# Patient Record
Sex: Female | Born: 1965
Health system: Southern US, Community
[De-identification: ages and names within clinical notes are randomized; demographics above are authoritative.]

## PROBLEM LIST (undated history)

## (undated) DIAGNOSIS — D649 Anemia, unspecified: Secondary | ICD-10-CM

## (undated) DIAGNOSIS — C801 Malignant (primary) neoplasm, unspecified: Secondary | ICD-10-CM

## (undated) DIAGNOSIS — M1990 Unspecified osteoarthritis, unspecified site: Secondary | ICD-10-CM

## (undated) HISTORY — PX: CHOLECYSTECTOMY: SHX55

## (undated) HISTORY — PX: KNEE SURGERY: SHX244

## (undated) HISTORY — PX: BREAST LUMPECTOMY: SHX2

## (undated) HISTORY — DX: Unspecified osteoarthritis, unspecified site: M19.90

## (undated) HISTORY — DX: Anemia, unspecified: D64.9

## (undated) HISTORY — DX: Malignant (primary) neoplasm, unspecified: C80.1

---

## 2011-05-29 ENCOUNTER — Encounter (HOSPITAL_COMMUNITY): Payer: Self-pay | Admitting: Emergency Medicine

## 2011-05-29 ENCOUNTER — Emergency Department (HOSPITAL_COMMUNITY)
Admission: EM | Admit: 2011-05-29 | Discharge: 2011-05-29 | Disposition: A | Discharge status: Home / Self Care | Payer: BC Managed Care – PPO | Attending: Emergency Medicine | Admitting: Emergency Medicine

## 2011-05-29 DIAGNOSIS — IMO0001 Reserved for inherently not codable concepts without codable children: Secondary | ICD-10-CM

## 2011-05-29 DIAGNOSIS — L500 Allergic urticaria: Secondary | ICD-10-CM | POA: Insufficient documentation

## 2011-05-29 MED ORDER — PREDNISONE 20 MG PO TABS: 60.0000 mg | ORAL_TABLET | Freq: Once | ORAL | Status: AC

## 2011-05-29 MED ORDER — DIPHENHYDRAMINE HCL 25 MG PO CAPS
50.0000 mg | ORAL_CAPSULE | Freq: Once | ORAL | Status: AC
  Administered 2011-05-29: 50 mg via ORAL
  Filled 2011-05-29: qty 2

## 2011-05-29 MED ORDER — FAMOTIDINE 20 MG PO TABS: 20.0000 mg | ORAL_TABLET | Freq: Once | ORAL | Status: DC

## 2011-05-29 MED ORDER — FAMOTIDINE 20 MG PO TABS
20.0000 mg | ORAL_TABLET | Freq: Once | ORAL | Status: AC
  Administered 2011-05-29: 20 mg via ORAL
  Filled 2011-05-29: qty 1

## 2011-05-29 MED ORDER — DIPHENHYDRAMINE HCL 50 MG PO CAPS: 50.0000 mg | ORAL_CAPSULE | Freq: Four times a day (QID) | ORAL | Status: DC | PRN

## 2011-05-29 MED ORDER — PREDNISONE 20 MG PO TABS
60.0000 mg | ORAL_TABLET | Freq: Once | ORAL | Status: AC
  Administered 2011-05-29: 60 mg via ORAL
  Filled 2011-05-29: qty 3

## 2011-05-29 NOTE — Discharge Instructions (Signed)
Reaccin alrgica - Leve a moderada (Allergic Reaction, Mild to Moderate) Las alergias pueden ser ocasionadas por cualquier cosa a la que su organismo sea sensible. Pueden ser alimentos, medicamentos, polen, sustancias qumicas y casi cualquiera de las cosas que lo rodean en su vida diaria que producen alrgenos. Un alrgeno es todo lo que hace que una sustancia produzca alergia. Esto hace que el organismo libere anticuerpos. A travs de una cadena de eventos, estos finalmente hacen que usted libere histamina en el torrente sanguneo. La funcin de la histamina es protegerlo, pero tambin puede causar molestias. Es por eso que en el caso de las alergias se utilizan los antihistamnicos. La herencia es uno de los factores que interviene en las reacciones alrgicas. Esto significa que usted puede sufrir alguna de las alergias que sufrieron sus padres. Las alergias pueden ocurrir a cualquier edad. Usted puede tener cierta idea de lo que ha causado la reaccin. Estamos rodeados por gran cantidad de alrgenos. Puede resultar difcil saber que caus su reaccin. Si este es su primer ataque, puede ser que no le vuelva a ocurrir. Las alergias no pueden curarse pero pueden controlarse con medicamentos. SNTOMAS Una alergia puede causarle alguno o todos los siguientes sntomas.   Hinchazn y prurito en el interior de la boca y alrededor de la misma.   Lagrimeo y picazn en los ojos.   Congestin nasal y goteo nasal.   Estornudos y tos.   Una erupcin roja que produce picazn o urticaria.   Vmitos o diarrea.   Dificultad para respirar.  Las alergias estacionales pueden ocurrir a cualquier edad. Se denominan as porque generalmente se producen durante la misma estacin todos los aos. Puede ser una reaccin al moho, al polen del csped o al polen de los rboles. Otras causas del problema son los alrgenos que contienen los caros del polvo del hogar, el pelaje de las mascotas y las esporas del moho. Estos  son slo algunos de los ms comunes de entre los cientos de alrgenos que nos rodean. Todos estos sntomas aparecen cuando se entra en contacto con el polen y otros alrgenos. Estas alergias estacionales generalmente no ponen en peligro la vida. Generalmente se trata de una incomodidad que puede aliviarse con medicamentos. La fiebre del heno es una combinacin de todos o algunos de los problemas alrgicos enumerados. Simplemente se tratan con medicamentos de venta libre como difenhidramina (Benadryl). Tome los medicamentos segn las indicaciones. Consulte con el profesional que lo asiste o siga las instrucciones de uso para las dosis para nios. TRATAMIENTO Y CUIDADO DOMICILIARIO Si presenta urticaria o una erupcin cutnea:  Tome los medicamentos como se le indic.   Puede utilizar un antihistamnico de venta libre (difenhidramina) para la urticaria y la picazn, segn sea necesario. No conduzca ni beba alcohol hasta que desaparezca el efecto de los medicamentos para tratar la reaccin. Los antihistamnicos causan somnolencia.   Aplquese compresas fras sobre la piel o tome baos de agua fra. Ayuda a calmar la picazn. Evite los baos o las duchas calientes. El calor puede hacer que la urticaria y la picazn empeoren.   Si las alergias persisten y se hacen ms graves, y los medicamentos de venta libre no son efectivos, existen muchos medicamentos nuevos que el mdico le puede prescribir Otros tratamientos como la inmunoterapia o las inyecciones desensibilizantes pueden utilizarse si todos estos fracasan. Haga una consulta de seguimiento con el profesional que lo asiste si los problemas continan.  SOLICITE ANTENCIN MDICA SI:  Las alergias se tornan cada   vez ms graves.   Sospecha que puede sufrir una alergia a algn alimento. Los sntomas generalmente ocurren dentro de los 30 minutos posteriores a haber ingerido el alimento.   Los sntomas persistieron durante 2 das o han empeorado.    Desarrolla nuevos sntomas.   Quiere volver a probar o que su hijo consuma nuevamente un alimento o bebida que usted cree que le causa una reaccin alrgica. Nunca realice una prueba en usted mismo, ni en su nio, si sospecha una alergia, si no se encuentra bajo la observacin de su mdico. Una segunda exposicin a un alrgeno puede poner en peligro su vida.  SOLICITE ATENCIN MDICA DE INMEDIATO SI PRESENTA:  Dificultad para respirar, respiracin ruidosa o siente una sensacin de opresin en el pecho o en la garganta.   Presenta sarpullido, hinchazn o picazn en todo el cuerpo.  Una reaccin grave con cualquiera de los problemas mencionados debe considerarse como peligrosa para la vida. Si presenta dificultad para respirar de manera sbita, comunquese con el servicio de emergencias y solicite asistencia mdica. ESTO ES UNA EMERGENCIA. ASEGRESE QUE:   Comprende estas instrucciones.   Controlar su enfermedad.   Solicitar ayuda inmediatamente si no mejora o si empeora.  Document Released: 04/25/2008 Document Revised: 01/16/2011 ExitCare Patient Information 2012 [employer]. 

## 2011-05-29 NOTE — ED Provider Notes (Signed)
History     CSN: [id number]  Arrival date & time 05/29/11  2159   First MD Initiated Contact with Patient 05/29/11 2227      Chief Complaint  Patient presents with  . Rash  . Allergic Reaction    (Consider location/radiation/quality/duration/timing/severity/associated sxs/prior treatment) Patient is a 46 y.o. female presenting with rash and allergic reaction. The history is provided by the patient and the spouse.  Rash  This is a new problem. The current episode started 1 to 2 hours ago. The problem has been gradually worsening. There has been no fever. Affected Location: Rash is generalized. Associated symptoms include itching. Associated symptoms comments: She ate shrimp 3 hours prior to symptoms. She has known shellfish allergy, however, but did not realize there was shrimp in the food she ate. No SOB, trouble swallowing.. She has tried nothing for the symptoms.  Allergic Reaction The primary symptoms are  rash. The primary symptoms do not include shortness of breath.  The rash is associated with itching.  Significant symptoms also include itching.    History reviewed. No pertinent past medical history.  History reviewed. No pertinent past surgical history.  No family history on file.  History  Substance Use Topics  . Smoking status: Not on file  . Smokeless tobacco: Not on file  . Alcohol Use: Not on file    OB History    Grav Para Term Preterm Abortions TAB SAB Ect Mult Living                  Review of Systems  Constitutional: Negative for fever and chills.  HENT: Negative for facial swelling, trouble swallowing and voice change.   Respiratory: Negative.  Negative for shortness of breath.   Cardiovascular: Negative.   Skin: Positive for itching and rash.    Allergies  Shellfish allergy  Home Medications  No current outpatient prescriptions on file.  BP 113/67  Pulse 72  Temp(Src) 98.5 F (36.9 C) (Oral)  Resp 18  SpO2 98%  Physical Exam    Constitutional: She appears well-developed and well-nourished.  HENT:  Head: Normocephalic.       No swelling of lips or tongue. Oropharynx benign.  Neck: Normal range of motion. Neck supple.  Cardiovascular: Normal rate and regular rhythm.   Pulmonary/Chest: Effort normal and breath sounds normal. No stridor. She has no wheezes. She has no rales.  Abdominal: Soft. Bowel sounds are normal. There is no tenderness. There is no rebound and no guarding.  Musculoskeletal: Normal range of motion. She exhibits no edema.  Neurological: She is alert.  Skin: Skin is warm and dry.       Maculopapular rash that is slightly erythematous in a general distribution. Some welts on upper arms.   Psychiatric: She has a normal mood and affect.    ED Course  Procedures (including critical care time)  Labs Reviewed - No data to display No results found.   No diagnosis found. 1. Acute allergic reaction   MDM  She is complaining of itching rash only, without airway involvment. Medications (Benadryl, Pepcid, Prednisone) given. Will observe for improvement.   23:45:  Feeling better, rash is still present but improved. Denied any onset of SOB or other symptoms.       Rodena Medin, PA-C 05/29/11 2346

## 2011-05-30 NOTE — ED Provider Notes (Signed)
Medical screening examination/treatment/procedure(s) were performed by non-physician practitioner and as supervising physician I was immediately available for consultation/collaboration.  Bobbe Quilter, MD 05/30/11 2305 

## 2011-11-13 ENCOUNTER — Emergency Department (HOSPITAL_COMMUNITY): Payer: Self-pay

## 2011-11-13 ENCOUNTER — Emergency Department (HOSPITAL_COMMUNITY)
Admission: EM | Admit: 2011-11-13 | Discharge: 2011-11-13 | Disposition: A | Discharge status: Home / Self Care | Payer: Self-pay | Attending: Emergency Medicine | Admitting: Emergency Medicine

## 2011-11-13 ENCOUNTER — Other Ambulatory Visit: Payer: Self-pay

## 2011-11-13 ENCOUNTER — Encounter (HOSPITAL_COMMUNITY): Payer: Self-pay | Admitting: Emergency Medicine

## 2011-11-13 DIAGNOSIS — R1031 Right lower quadrant pain: Secondary | ICD-10-CM | POA: Insufficient documentation

## 2011-11-13 DIAGNOSIS — R109 Unspecified abdominal pain: Secondary | ICD-10-CM

## 2011-11-13 DIAGNOSIS — R197 Diarrhea, unspecified: Secondary | ICD-10-CM | POA: Insufficient documentation

## 2011-11-13 DIAGNOSIS — R112 Nausea with vomiting, unspecified: Secondary | ICD-10-CM | POA: Insufficient documentation

## 2011-11-13 LAB — COMPREHENSIVE METABOLIC PANEL
ALT: 37 U/L — ABNORMAL HIGH (ref 0–35)
AST: 18 U/L (ref 0–37)
Calcium: 9.1 mg/dL (ref 8.4–10.5)
Creatinine, Ser: 0.63 mg/dL (ref 0.50–1.10)
GFR calc Af Amer: >90 mL/min (ref >90)
GFR calc non Af Amer: >90 mL/min (ref >90)
Sodium: 136 mEq/L (ref 135–145)
Total Protein: 7.2 g/dL (ref 6.0–8.3)

## 2011-11-13 LAB — CBC WITH DIFFERENTIAL/PLATELET
Basophils Absolute: 0 10*3/uL (ref 0.0–0.1)
Eosinophils Absolute: 0.1 10*3/uL (ref 0.0–0.7)
Eosinophils Relative: 1 % (ref 0–5)
HCT: 35.4 % — ABNORMAL LOW (ref 36.0–46.0)
MCH: 26.5 pg (ref 26.0–34.0)
MCHC: 31.9 g/dL (ref 30.0–36.0)
MCV: 82.9 fL (ref 78.0–100.0)
Monocytes Absolute: 0.6 10*3/uL (ref 0.1–1.0)
Platelets: 277 10*3/uL (ref 150–400)
RDW: 15.3 % (ref 11.5–15.5)
WBC: 10.5 10*3/uL (ref 4.0–10.5)

## 2011-11-13 LAB — URINALYSIS, ROUTINE W REFLEX MICROSCOPIC
Bilirubin Urine: NEGATIVE
Glucose, UA: NEGATIVE mg/dL
Hgb urine dipstick: NEGATIVE
Ketones, ur: NEGATIVE mg/dL
Protein, ur: NEGATIVE mg/dL
Urobilinogen, UA: 0.2 mg/dL (ref 0.0–1.0)

## 2011-11-13 LAB — PREGNANCY, URINE: Preg Test, Ur: NEGATIVE

## 2011-11-13 MED ORDER — LORAZEPAM 2 MG/ML IJ SOLN
0.5000 mg | Freq: Once | INTRAMUSCULAR | Status: AC
  Administered 2011-11-13: 0.5 mg via INTRAVENOUS
  Filled 2011-11-13: qty 1

## 2011-11-13 MED ORDER — SUCRALFATE 1 GM/10ML PO SUSP: 1.0000 g | Freq: Four times a day (QID) | ORAL | Status: DC

## 2011-11-13 MED ORDER — HYDROMORPHONE HCL PF 1 MG/ML IJ SOLN
1.0000 mg | Freq: Once | INTRAMUSCULAR | Status: AC
  Administered 2011-11-13: 1 mg via INTRAVENOUS
  Filled 2011-11-13: qty 1

## 2011-11-13 MED ORDER — ONDANSETRON HCL 4 MG/2ML IJ SOLN
4.0000 mg | Freq: Once | INTRAMUSCULAR | Status: AC
  Administered 2011-11-13: 4 mg via INTRAVENOUS
  Filled 2011-11-13: qty 2

## 2011-11-13 MED ORDER — ONDANSETRON HCL 4 MG PO TABS: 4.0000 mg | ORAL_TABLET | Freq: Four times a day (QID) | ORAL | Status: DC

## 2011-11-13 MED ORDER — IOHEXOL 300 MG/ML  SOLN
100.0000 mL | Freq: Once | INTRAMUSCULAR | Status: AC | PRN
Start: 2011-11-13 — End: 2011-11-13
  Administered 2011-11-13: 100 mL via INTRAVENOUS

## 2011-11-13 MED ORDER — GI COCKTAIL ~~LOC~~
30.0000 mL | Freq: Once | ORAL | Status: AC
  Administered 2011-11-13: 30 mL via ORAL
  Filled 2011-11-13: qty 30

## 2011-11-13 MED ORDER — SODIUM CHLORIDE 0.9 % IV BOLUS (SEPSIS)
1000.0000 mL | Freq: Once | INTRAVENOUS | Status: AC
  Administered 2011-11-13: 1000 mL via INTRAVENOUS

## 2011-11-13 NOTE — ED Notes (Signed)
CT called and made aware that pt finished drinking oral contrast at 12:30.

## 2011-11-13 NOTE — ED Notes (Signed)
Oral contrast given to pt by radiology staff, pt instructed to begin drinking at least 20 minutes after GI cocktail given with understanding verbalized.

## 2011-11-13 NOTE — ED Notes (Signed)
MD at bedside. 

## 2011-11-13 NOTE — ED Notes (Signed)
Sent here from Dr. Alric Ran office to be evaluated for GI bleed/appendicitis. Woke up at 4am, abd pain, with vomiting and diarrhea black tarry looking stool, vomited greenish bile looking emesis.

## 2011-11-13 NOTE — ED Notes (Signed)
Patient transported to CT 

## 2011-11-13 NOTE — ED Provider Notes (Addendum)
History     CSN: [id number]  Arrival date & time 11/13/11  1034   First MD Initiated Contact with Patient 11/13/11 1105      Chief Complaint  Patient presents with  . GI Bleeding  . RLQ pain     (Consider location/radiation/quality/duration/timing/severity/associated sxs/prior treatment) HPI The patient presents with concerns of abdominal pain, vomiting, diarrhea.  She notes that symptoms began yesterday, though became pronounced overnight, within the past 8 hours.  Since onset she has had crampy abdominal pain, nausea.  She had multiple episodes of emesis, green/yellow, and dark diarrhea.  No bowel movements recently.  She notes that the pain was initially sharp, crampy, periumbilical, but is now in her right lower quadrant.  There is some radiation superiorly.   She denies fever, confusion, disorientation, dyspnea. She states that she has been in her usual state of health. She has a history of cholecystectomy in the distant past.   History reviewed. No pertinent past medical history.  Past Surgical History  Procedure Date  . Cholecystectomy     No family history on file.  History  Substance Use Topics  . Smoking status: Former Games developer  . Smokeless tobacco: Not on file  . Alcohol Use: No     for greater than 7 years    OB History    Grav Para Term Preterm Abortions TAB SAB Ect Mult Living                  Review of Systems  Constitutional:       HPI  HENT:       HPI otherwise negative  Eyes: Negative.   Respiratory:       HPI, otherwise negative  Cardiovascular:       HPI, otherwise nmegative  Gastrointestinal: Positive for nausea, vomiting, abdominal pain and diarrhea.  Genitourinary:       HPI, otherwise negative  Musculoskeletal:       HPI, otherwise negative  Skin: Negative.   Neurological: Negative for syncope.    Allergies  Shellfish allergy  Home Medications   Current Outpatient Rx  Name Route Sig Dispense Refill  . DIPHENHYDRAMINE HCL 50  MG PO CAPS Oral Take 1 capsule (50 mg total) by mouth every 6 (six) hours as needed for itching. 12 capsule 0  . FAMOTIDINE 20 MG PO TABS Oral Take 1 tablet (20 mg total) by mouth once. 8 tablet 0    BP 127/83  Pulse 94  Temp 98.1 F (36.7 C)  Resp 20  SpO2 100%  LMP 11/05/2011  Physical Exam  Nursing note and vitals reviewed. Constitutional: She is oriented to person, place, and time. She appears well-developed and well-nourished. No distress.  HENT:  Head: Normocephalic and atraumatic.  Eyes: Conjunctivae normal and EOM are normal.  Cardiovascular: Normal rate and regular rhythm.   Pulmonary/Chest: Effort normal and breath sounds normal. No stridor. No respiratory distress.  Abdominal: She exhibits no distension. There is no hepatosplenomegaly. There is tenderness in the right lower quadrant, periumbilical area, suprapubic area and left lower quadrant. There is guarding. There is no rigidity, no rebound and no CVA tenderness.  Musculoskeletal: She exhibits no edema.  Neurological: She is alert and oriented to person, place, and time. No cranial nerve deficit.  Skin: Skin is warm and dry.  Psychiatric: She has a normal mood and affect.    ED Course  Procedures (including critical care time)  Labs Reviewed  CBC WITH DIFFERENTIAL - Abnormal; Notable for the  following:    Hemoglobin 11.3 (*)     HCT 35.4 (*)     Neutro Abs 7.8 (*)     All other components within normal limits  URINALYSIS, ROUTINE W REFLEX MICROSCOPIC  COMPREHENSIVE METABOLIC PANEL  LIPASE, BLOOD  PREGNANCY, URINE   No results found.   No diagnosis found.   Pulse ox 99% room air normal   Date: 11/13/2011  Rate: 89  Rhythm: normal sinus rhythm  QRS Axis: normal  Intervals: normal  ST/T Wave abnormalities: normal  Conduction Disutrbances: none  Narrative Interpretation: unremarkable      12:09 PM   Following provision of dilaudid the patient noted an increase in her nausea / cp / and on my  re-eval she had chattering teeth, VSS  On repeat evaluation after the CAT scan the patient is resting comfortably, notes her symptoms are significantly better.   MDM  This previously well female presents with one day of abdominal pain nausea, vomiting, diarrhea.  On exam she is uncomfortable with palpation, but otherwise in no distress.  The patient's tenderness is most prominent in her right lower quadrant, raising suspicion for appendicitis, though her description of symptoms is equally consistent with gastroenteritis.  The patient's labs, radiographic studies were all reassuring, and following ED interventions the patient was substantially more comfortable.  Given his absence of distress, unremarkable vital signs, the patient was discharged in stable condition with PMD/GI followup.      Gerhard Munch, MD 11/13/11 1603  Gerhard Munch, MD 11/13/11 [phone]

## 2013-10-13 ENCOUNTER — Ambulatory Visit: Payer: BC Managed Care – PPO | Admitting: Cardiology

## 2013-10-13 ENCOUNTER — Encounter: Payer: Self-pay | Admitting: *Deleted

## 2013-11-17 ENCOUNTER — Ambulatory Visit: Payer: BC Managed Care – PPO | Admitting: Cardiology

## 2013-11-28 ENCOUNTER — Encounter: Payer: Self-pay | Admitting: Cardiology

## 2015-04-07 ENCOUNTER — Other Ambulatory Visit (HOSPITAL_COMMUNITY)
Admission: EM | Admit: 2015-04-07 | Discharge: 2015-04-07 | Disposition: A | Discharge status: Home / Self Care | Payer: BLUE CROSS/BLUE SHIELD

## 2015-04-07 ENCOUNTER — Emergency Department (HOSPITAL_COMMUNITY): Payer: BLUE CROSS/BLUE SHIELD

## 2015-04-07 ENCOUNTER — Encounter (HOSPITAL_COMMUNITY): Payer: Self-pay | Admitting: Emergency Medicine

## 2015-04-07 ENCOUNTER — Emergency Department (HOSPITAL_COMMUNITY)
Admission: EM | Admit: 2015-04-07 | Discharge: 2015-04-07 | Disposition: A | Discharge status: Home / Self Care | Payer: BLUE CROSS/BLUE SHIELD | Attending: Emergency Medicine | Admitting: Emergency Medicine

## 2015-04-07 DIAGNOSIS — N939 Abnormal uterine and vaginal bleeding, unspecified: Secondary | ICD-10-CM

## 2015-04-07 DIAGNOSIS — Y9389 Activity, other specified: Secondary | ICD-10-CM | POA: Insufficient documentation

## 2015-04-07 DIAGNOSIS — Z87891 Personal history of nicotine dependence: Secondary | ICD-10-CM | POA: Diagnosis not present

## 2015-04-07 DIAGNOSIS — S3991XA Unspecified injury of abdomen, initial encounter: Secondary | ICD-10-CM | POA: Diagnosis not present

## 2015-04-07 DIAGNOSIS — W19XXXA Unspecified fall, initial encounter: Secondary | ICD-10-CM

## 2015-04-07 DIAGNOSIS — W108XXA Fall (on) (from) other stairs and steps, initial encounter: Secondary | ICD-10-CM | POA: Diagnosis not present

## 2015-04-07 DIAGNOSIS — D251 Intramural leiomyoma of uterus: Secondary | ICD-10-CM | POA: Diagnosis not present

## 2015-04-07 DIAGNOSIS — Y9289 Other specified places as the place of occurrence of the external cause: Secondary | ICD-10-CM | POA: Diagnosis not present

## 2015-04-07 DIAGNOSIS — S3992XA Unspecified injury of lower back, initial encounter: Secondary | ICD-10-CM | POA: Diagnosis not present

## 2015-04-07 DIAGNOSIS — S3993XA Unspecified injury of pelvis, initial encounter: Secondary | ICD-10-CM | POA: Diagnosis not present

## 2015-04-07 DIAGNOSIS — Y998 Other external cause status: Secondary | ICD-10-CM | POA: Diagnosis not present

## 2015-04-07 DIAGNOSIS — Z3202 Encounter for pregnancy test, result negative: Secondary | ICD-10-CM | POA: Diagnosis not present

## 2015-04-07 DIAGNOSIS — Z87828 Personal history of other (healed) physical injury and trauma: Secondary | ICD-10-CM

## 2015-04-07 MED ORDER — MORPHINE SULFATE (PF) 4 MG/ML IV SOLN
4.0000 mg | Freq: Once | INTRAVENOUS | Status: AC
  Administered 2015-04-07: 4 mg via INTRAVENOUS
  Filled 2015-04-07: qty 1

## 2015-04-07 MED ORDER — IOHEXOL 350 MG/ML SOLN
100.0000 mL | Freq: Once | INTRAVENOUS | Status: AC | PRN
  Administered 2015-04-07: 100 mL via INTRAVENOUS

## 2015-04-07 MED ORDER — IOHEXOL 300 MG/ML  SOLN: 25.0000 mL | Freq: Once | INTRAMUSCULAR | Status: DC | PRN

## 2015-04-07 MED ORDER — HYDROCODONE-ACETAMINOPHEN 5-325 MG PO TABS: 1.0000 | ORAL_TABLET | Freq: Four times a day (QID) | ORAL | Status: AC | PRN

## 2015-04-07 MED ORDER — ONDANSETRON HCL 4 MG/2ML IJ SOLN
4.0000 mg | Freq: Once | INTRAMUSCULAR | Status: AC
  Administered 2015-04-07: 4 mg via INTRAVENOUS
  Filled 2015-04-07: qty 2

## 2015-04-07 MED ORDER — NAPROXEN 500 MG PO TABS: 500.0000 mg | ORAL_TABLET | Freq: Two times a day (BID) | ORAL | Status: DC

## 2015-04-07 NOTE — ED Notes (Signed)
Patient ambulated to restroom with stand by nursing assist. Steady on feet at this time. Patient given pad and mesh underwear.

## 2015-04-07 NOTE — ED Notes (Signed)
MD and PA at bedside to complete bedside US on patient of abdomen. No internal bleeding noted at this time.

## 2015-04-07 NOTE — Discharge Instructions (Signed)
Taking Naprosyn for pain and inflammation as prescribed. Norco for severe pain only. Pelvic rest. If bleeding is worsening or develop any worsening pain or symptoms please return or follow-up with OB/GYN.   Sangrado uterino anormal (Abnormal Uterine Bleeding) El sangrado uterino anormal puede afectar a las mujeres que estn en diversas etapas de la vida, desde adolescentes, mujeres frtiles y Games developer, hasta mujeres que han llegado a la menopausia. Hay diversas clases de sangrado uterino que se consideran anormales, entre ellas:  Prdidas de sangre o [employer] perodos.  Hemorragias luego de Retail banker.  Sangrado abundante o ms que lo habitual.  Perodos que duran ms que lo normal.  Sangrado luego de la menopausia. Muchos casos de sangrado uterino anormal son leves y simples de tratar, mientras que otros son ms graves. El mdico debe evaluar cualquier clase de sangrado anormal. El tratamiento depender de la causa del sangrado. INSTRUCCIONES PARA EL CUIDADO EN EL HOGAR Controle su afeccin para ver si hay cambios. Las siguientes indicaciones ayudarn a Chief Strategy Officer que pueda sentir:  Evite las duchas vaginales y el uso de tampones segn las indicaciones del mdico.  [place] compresas con frecuencia. Deber hacerse exmenes plvicos regulares y pruebas de [place]. Cumpla con todas las visitas de control y [employer] diagnsticos, segn le indique su mdico.  SOLICITE ATENCIN MDICA SI:   El sangrado dura ms de 1 semana.  Se siente mareada por momentos. SOLICITE ATENCIN MDICA DE INMEDIATO SI:   Se desmaya.  Debe cambiarse la compresa cada 15 a 30 minutos.  Siente dolor abdominal.  [place].  Se siente dbil o presenta sudoracin.  Elimina cogulos grandes por la vagina.  Comienza a sentir nuseas y [place]. ASEGRESE DE QUE:   Comprende estas instrucciones.  Controlar su afeccin.  Recibir ayuda  de inmediato si no mejora o si empeora.   Esta informacin no tiene Marine scientist el consejo del mdico. Asegrese de hacerle al mdico cualquier pregunta que tenga.   Document Released: 01/27/2005 Document Revised: 02/01/2013 Elsevier Interactive Patient Education 2016 [place] uterinos (Uterine Fibroids) Los fibromas uterinos son masas (tumores) de tejido que pueden desarrollarse en el vientre (tero). Tambin se los [employer]. Este tipo de tumor no es Radio broadcast assistant (benigno) y no se disemina a Airline pilot del cuerpo fuera de la zona plvica, la cual se encuentra entre los huesos de la cadera. En ocasiones, los fibromas pueden crecer en las trompas de [place], en el cuello del tero o en las estructuras de soporte (ligamentos) que rodean el tero. Una mujer puede tener uno o ms fibromas. Los fibromas pueden tener diferente tamao y [place], y crecer en distintas partes del tero. Algunos pueden crecer hasta volverse bastante grandes. La mayora no requiere tratamiento mdico. CAUSAS Un fibroma puede desarrollarse cuando una nica clula uterina contina creciendo (se multiplica). La mayora de las clulas del cuerpo humano tienen un mecanismo de control que impide que se multipliquen sin control. [place] los sntomas se pueden incluir los siguientes:   Hemorragias intensas durante la menstruacin.  Prdidas de sangre o [employer].  Dolor y opresin en la pelvis.  Problemas de la vejiga, como necesidad de Garment/textile technologist con ms frecuencia (polaquiuria) o necesidad imperiosa de Garment/textile technologist.  Incapacidad para reproducir (infertilidad).  Abortos espontneos. DIAGNSTICO Los fibromas uterinos se diagnostican con un examen fsico. El mdico puede palpar los tumores grumosos durante un examen plvico. Pueden realizarse ecografas y Janice Norrie magntica para  determinar el tamao y la ubicacin de los fibromas, as como la  cantidad. TRATAMIENTO El tratamiento puede incluir lo siguiente:  Observacin cautelosa. Esto requiere que el mdico controle el fibroma para saber si crece o se achica. Siga las recomendaciones del mdico respecto de la frecuencia con la que debe realizarse los controles.  Medicamentos hormonales. Pueden tomarse por va oral o administrarse a travs de un dispositivo intrauterino (DIU).  Ciruga.  Extirpacin de los fibromas (miomectoma) o del tero (histerectoma).  Suprimir la irrigacin sangunea a los fibromas (embolizacin de la arteria uterina). Si los fibromas le traen problemas de fertilidad y tiene deseos de quedar [place], el mdico puede recomendar su extirpacin.  INSTRUCCIONES PARA EL CUIDADO EN EL HOGAR  Concurra a todas las visitas de control como se lo haya indicado el mdico. Esto es importante.  Tome los medicamentos solamente como se lo haya indicado el mdico.  Si le recetaron un tratamiento hormonal, tome los medicamentos hormonales exactamente como se lo indicaron.  No tome aspirina, ya que puede causar hemorragias.  Consulte al [employer] sobre tomar comprimidos de hierro y Garment/textile technologist la cantidad de verduras de hoja color verde oscuro en la dieta. Estas medidas pueden ayudar a Transport planner de hierro en la [place], que pueden verse afectados por las hemorragias menstruales intensas.  Preste mucha atencin a Hydrographic surveyor e informe al mdico si hay algn cambio, por ejemplo:  Aumento del flujo de sangre que le exige el uso de ms compresas o tampones que los que utiliza normalmente cada mes.  Un cambio en la cantidad de [employer] dura la menstruacin cada mes.  Un cambio en los sntomas asociados con la [place], como clicos abdominales o dolor de espalda. SOLICITE ATENCIN MDICA SI:  Tiene dolor plvico, dolor de espalda o clicos abdominales que los medicamentos no Engineer, petroleum.  Observa un aumento del sangrado entre y [employer].  Empapa los tampones o las compresas en el trmino de media hora o [place].  Se siente mareada, muy cansada o dbil. SOLICITE ATENCIN MDICA DE INMEDIATO SI:  Se desmaya.  El dolor plvico aumenta repentinamente.   Esta informacin no tiene Marine scientist el consejo del mdico. Asegrese de hacerle al mdico cualquier pregunta que tenga.   Document Released: 01/27/2005 Document Revised: 02/17/2014 Elsevier Interactive Patient Education [employer].

## 2015-04-07 NOTE — ED Provider Notes (Signed)
CSN: [id number]     Arrival date & time 04/07/15  1554 History   First MD Initiated Contact with Patient 04/07/15 1618     Chief Complaint  Patient presents with  . Fall  . Vaginal Bleeding     (Consider location/radiation/quality/duration/timing/severity/associated sxs/prior Treatment) HPI Tara Ray is a 50 y.o. female who presents to emergency department complaining of fall and lower abdominal and back pain. Patient states that she was walking down the stairs, states she missed a step and fell down onto her bottom and slid down approximately 3 steps. She states that she immediately developed pain in her lower back and lower abdomen. A few minutes later she noticed that she was hemorrhaging from her private area. Patient states she is unsure exactly where the bleeding is coming from. She describes bleeding as severe, states "my underwear MI close was covered in blood." She states that she went home and changed her clothes and then came to the emergency department. She reports the bleeding continues. She reports severe pain over the pelvic area. She denies hitting her head or losing consciousness. She denies pain to her extremities. She is able to ambulate but very painful.   History reviewed. No pertinent past medical history. Past Surgical History  Procedure Laterality Date  . Cholecystectomy     Family History  Problem Relation Age of Onset  . Hypertension Mother    Social History  Substance Use Topics  . Smoking status: Former Research scientist (life sciences)  . Smokeless tobacco: None  . Alcohol Use: No     Comment: for greater than 7 years   OB History    No data available     Review of Systems  Constitutional: Negative for fever and chills.  Respiratory: Negative for cough, chest tightness and shortness of breath.   Cardiovascular: Negative for chest pain, palpitations and leg swelling.  Gastrointestinal: Positive for abdominal pain. Negative for nausea, vomiting and diarrhea.   Genitourinary: Positive for pelvic pain. Negative for dysuria, flank pain, vaginal bleeding, vaginal discharge and vaginal pain.  Musculoskeletal: Positive for myalgias, back pain, arthralgias and gait problem. Negative for neck pain and neck stiffness.  Skin: Negative for rash.  Neurological: Negative for dizziness, weakness, numbness and headaches.  All other systems reviewed and are negative.     Allergies  Shellfish allergy  Home Medications   Prior to Admission medications   Medication Sig Start Date End Date Taking? Authorizing Provider  ibuprofen (ADVIL,MOTRIN) 200 MG tablet Take 400 mg by mouth every 6 (six) hours as needed for headache, mild pain or moderate pain.   Yes Historical Provider, MD   BP 132/74 mmHg  Pulse 84  Temp(Src) 97.8 F (36.6 C) (Oral)  Resp 18  SpO2 100% Physical Exam  Constitutional: She appears well-developed and well-nourished. No distress.  HENT:  Head: Normocephalic.  Eyes: Conjunctivae are normal.  Neck: Neck supple.  Cardiovascular: Normal rate, regular rhythm and normal heart sounds.   Pulmonary/Chest: Effort normal and breath sounds normal. No respiratory distress. She has no wheezes. She has no rales.  Abdominal: Soft. Bowel sounds are normal. She exhibits no distension. There is tenderness. There is no rebound.  Lower abdominal tenderness with guarding  Genitourinary:  Normal external genitalia with no tears noted. Normal vaginal canal except for pulling of the blood. Cervix is normal. No lesions visualized.  Musculoskeletal: She exhibits no edema.  Midline lumbar spine tenderness, bilateral pelvic tenderness. Pain with range of motion of bilateral hips. DP pulses intact.  Neurological: She is alert.  Skin: Skin is warm and dry.  Psychiatric: She has a normal mood and affect. Her behavior is normal.  Nursing note and vitals reviewed.   ED Course  Procedures (including critical care time) Labs Review Labs Reviewed  I-STAT CHEM  8, ED  I-STAT BETA HCG BLOOD, ED ([facility], [facility], [facility] ONLY)    Imaging Review Ct Abdomen Pelvis W Contrast  04/07/2015  CLINICAL DATA:  Fall this morning. Vaginal bleeding since that time. EXAM: CT ABDOMEN AND PELVIS WITH CONTRAST TECHNIQUE: Multidetector CT imaging of the abdomen and pelvis was performed using the standard protocol following bolus administration of intravenous contrast. CONTRAST:  16mL OMNIPAQUE IOHEXOL 350 MG/ML SOLN COMPARISON:  None. FINDINGS: The lung bases are normal. No free air or fluid. The liver and portal vein are normal. The patient is status postcholecystectomy. Prominence of the bile ducts is likely due to previous cholecystectomy. The spleen and right adrenal gland are normal. There is a small nodule associated with the left adrenal gland measuring 12 mm. This nodule does appear to enhance. The left kidney is normal in appearance. The right kidney demonstrates multiple areas of cortical thinning consistent with previous infarcts or infections. No acute abnormality in the right kidney. The pancreas is normal in appearance. The abdominal aorta is non aneurysmal with no dissection. No adenopathy identified within the abdomen. There is a fat containing umbilical hernia. The ventral wall is otherwise intact. The stomach and small bowel are within normal limits. The colon and appendix are normal. There is a fibroid near the uterine fundus measuring up to 2.7 cm. Another probable fibroid is seen posteriorly in the body of the uterus on image 128 measuring 1 cm. No other uterine masses are identified. The uterus is otherwise within normal limits. The right ovary is normal in appearance. There appear to be 2 small cysts in the left ovary seen on image 115, both measuring approximately 1 cm. The bladder is within normal limits. Pelvic loops of bowel are normal. No adenopathy seen in the pelvis. Sclerotic foci in the pelvis are nonspecific but may be bone islands. No other bony abnormalities are  identified. IMPRESSION: 1. There are 2 fibroids seen in the uterus. Uterus is otherwise normal in appearance. 2. 2 1 cm cysts in the left ovary are of doubtful significance. 3. Cortical thinning throughout the right kidney consistent with previous infarcts or infection. 4. 12 mm left adrenal nodule. This nodule does appear to enhance and is nonspecific. MRI could further characterize. Of note, arterial phase imaging was attempted but failed due to technical difficulties. Electronically Signed   By: Dorise Bullion III M.D   On: 04/07/2015 20:20   Dg Pelvis Portable  04/07/2015  CLINICAL DATA:  Fall today at work.  Pain EXAM: PORTABLE PELVIS 1-2 VIEWS COMPARISON:  None. FINDINGS: SI joints and hip joints are symmetric and unremarkable. No acute bony abnormality. Specifically, no fracture, subluxation, or dislocation. Soft tissues are intact. IMPRESSION: Negative. Electronically Signed   By: Rolm Baptise M.D.   On: 04/07/2015 17:02   I have personally reviewed and evaluated these images and lab results as part of my medical decision-making.   EKG Interpretation None      MDM   Final diagnoses:  History of pelvic trauma  Vaginal bleeding  Fall, initial encounter  Intramural leiomyoma of uterus      Patient with heavy vaginal bleeding onset after a fall in her bottom. Patient is complaining of abdominal pain and  lower back pain. On exam patient with pooling blood in her vaginal canal. No obvious pain and vaginal area no obvious tears. Bedside FAST exam by Dr. Dolly Rias with no definite free fluid. Bedside pelvic x-ray negative. I discussed with radiologist the study choice, will order CT angiogram of abdomen and pelvis for further evaluation. Hemodynamically stable at this time. With normal vital signs.  5:35 PM  computer glitch with patient's lab orders. now 3 times we ordered blood work which continues to be canceled by "lab." I discussed with lab tech, they will work on that issue.    Patient's lab work did not crossover. Normal PT and PTT at PT-14, PTT- 27.7. White blood cell count is 7. Hemoglobin 10.6, hematocrit 34. Platelets 257. Chemistry panel showed normal electrolytes and creatinine of 0.7. Will get CT abdomen and pelvis.   8:59 PM Another problem with CT due to patient's registration. CT finally resulted with no acute injuries. CT does show 2 fibroids in the uterus. Also showing some thinning of the right kidney consistent with prior infarct or infection. 1 cm cyst noted in the left ovary. 12 mm left adrenal nodule that is nonspecific, will follow palpation. I discussed patient's presentation, symptoms and CT finding with Dr. Roselie Awkward with OB/GYN. Patient also now states that she did start her period 2 days ago but states it stopped yesterday. She states her periods normally last 4 days. Dr. Roselie Awkward believes this could be just in the irregular menstrual bleeding. At this time patient has normal vital signs. She has been in emergency department for 5 hours now. She continues to be hemodynamically stable. We'll discharge home, given strict precautions to return if worsening bleeding. Pt voiced udnerstanding.   Filed Vitals:   04/07/15 1602 04/07/15 1900 04/07/15 2042 04/07/15 2125  BP: 132/74 124/72 117/68 135/70  Pulse: 84 68 67 63  Temp: 97.8 F (36.6 C)  98.1 F (36.7 C) 98.4 F (36.9 C)  TempSrc: Oral  Oral Oral  Resp: 18 16 17 13   SpO2: 100% 100% 98% 97%     Jeannett Senior, PA-C 04/08/15 0044  Merrily Pew, MD 04/08/15 1156

## 2015-04-07 NOTE — ED Notes (Signed)
Pt reports she fell at 1230 this am. No LOC. When she stood up she began to have vaginal bleeding. Pt reports continuing vaginal bleeding and bilateral lower abd pain.

## 2015-04-07 NOTE — ED Notes (Signed)
Patient transported to CT 

## 2015-04-07 NOTE — ED Provider Notes (Signed)
Medical screening examination/treatment/procedure(s) were conducted as a shared visit with non-physician practitioner(s) and myself.  I personally evaluated the patient during the encounter.  Fall with subsequent vaginal bleeding. Had started period a couple days ago but then stopped. Exam by PA showed pooling blood in the vaginal vault but not severe hemmorhaging. On my exam, not tachycardic or pale or hypotensive. Abdomen benign. FAST exam negative for free fluid. Concern for possible uterine rupture, fibroid rupture, endometriosis v normal menstrual cycle. Plan for ct scan to rule out uterine rupture and dispo accordingly.   FAST BEDSIDE US Indication: abdominal pain with vaginal bleeding after fall  4 Views obtained: Splenorenal, Morrison's Pouch, Retrovesical No free fluid in abdomen No difficulty obtaining views. Archived electronically I personally performed and interrepreted the images   Merrily Pew, MD 04/08/15 1151

## 2015-04-09 LAB — TYPE AND SCREEN
ABO/RH(D): O POS
ANTIBODY SCREEN: NEGATIVE

## 2015-04-09 LAB — COMPREHENSIVE METABOLIC PANEL
ALBUMIN: 3.5 g/dL (ref 3.5–5.0)
ALT: 24 U/L (ref 14–54)
AST: 17 U/L (ref 15–41)
Alkaline Phosphatase: 78 U/L (ref 38–126)
Anion gap: 7 (ref 5–15)
BILIRUBIN TOTAL: 0.2 mg/dL — AB (ref 0.3–1.2)
BUN: 13 mg/dL (ref 6–20)
CO2: 22 mmol/L (ref 22–32)
CREATININE: 0.74 mg/dL (ref 0.44–1.00)
Calcium: 8.9 mg/dL (ref 8.9–10.3)
Chloride: 111 mmol/L (ref 101–111)
GFR calc Af Amer: >60 mL/min (ref >60)
GLUCOSE: 92 mg/dL (ref 65–99)
POTASSIUM: 3.7 mmol/L (ref 3.5–5.1)
Sodium: 140 mmol/L (ref 135–145)
TOTAL PROTEIN: 6.5 g/dL (ref 6.5–8.1)

## 2015-04-09 LAB — CBC WITH DIFFERENTIAL/PLATELET
BAND NEUTROPHILS: 0 %
BASOS ABS: 0 10*3/uL (ref 0.0–0.1)
BASOS PCT: 0 %
Eosinophils Absolute: 0.2 10*3/uL (ref 0.0–0.7)
Eosinophils Relative: 3 %
HEMATOCRIT: 34 % — AB (ref 36.0–46.0)
HEMOGLOBIN: 10.6 g/dL — AB (ref 12.0–15.0)
LYMPHS PCT: 29 %
Lymphs Abs: 2 10*3/uL (ref 0.7–4.0)
MCH: 25.1 pg — ABNORMAL LOW (ref 26.0–34.0)
MCHC: 31.2 g/dL (ref 30.0–36.0)
MCV: 80.4 fL (ref 78.0–100.0)
MONO ABS: 0.4 10*3/uL (ref 0.1–1.0)
Monocytes Relative: 6 %
NEUTROS ABS: 4.3 10*3/uL (ref 1.7–7.7)
NEUTROS PCT: 62 %
Platelets: 257 10*3/uL (ref 150–400)
RBC: 4.23 MIL/uL (ref 3.87–5.11)
RDW: 19.4 % — AB (ref 11.5–15.5)
WBC: 7 10*3/uL (ref 4.0–10.5)

## 2015-04-09 LAB — PROTIME-INR
INR: 1.1 (ref 0.00–1.49)
Prothrombin Time: 14 seconds (ref 11.6–15.2)

## 2015-04-09 LAB — I-STAT CHEM 8, ED
BUN: 11 mg/dL (ref 6–20)
CALCIUM ION: 1.22 mmol/L (ref 1.12–1.23)
Chloride: 108 mmol/L (ref 101–111)
Creatinine, Ser: 0.7 mg/dL (ref 0.44–1.00)
Glucose, Bld: 97 mg/dL (ref 65–99)
HCT: 33 % — ABNORMAL LOW (ref 36.0–46.0)
Hemoglobin: 11.2 g/dL — ABNORMAL LOW (ref 12.0–15.0)
Potassium: 3.6 mmol/L (ref 3.5–5.1)
SODIUM: 142 mmol/L (ref 135–145)
TCO2: 23 mmol/L (ref 0–100)

## 2015-04-09 LAB — APTT: aPTT: 28 seconds (ref 24–37)

## 2015-04-09 LAB — I-STAT BETA HCG BLOOD, ED (MC, WL, AP ONLY): I-stat hCG, quantitative: <5 m[IU]/mL (ref <5)

## 2016-01-01 ENCOUNTER — Encounter (HOSPITAL_COMMUNITY): Payer: Self-pay | Admitting: *Deleted

## 2016-01-01 ENCOUNTER — Emergency Department (HOSPITAL_COMMUNITY): Payer: BLUE CROSS/BLUE SHIELD

## 2016-01-01 ENCOUNTER — Emergency Department (HOSPITAL_COMMUNITY)
Admission: EM | Admit: 2016-01-01 | Discharge: 2016-01-02 | Disposition: A | Discharge status: Home / Self Care | Payer: BLUE CROSS/BLUE SHIELD | Attending: Emergency Medicine | Admitting: Emergency Medicine

## 2016-01-01 DIAGNOSIS — S2020XA Contusion of thorax, unspecified, initial encounter: Secondary | ICD-10-CM | POA: Insufficient documentation

## 2016-01-01 DIAGNOSIS — S70312A Abrasion, left thigh, initial encounter: Secondary | ICD-10-CM | POA: Insufficient documentation

## 2016-01-01 DIAGNOSIS — Y999 Unspecified external cause status: Secondary | ICD-10-CM | POA: Insufficient documentation

## 2016-01-01 DIAGNOSIS — Y92410 Unspecified street and highway as the place of occurrence of the external cause: Secondary | ICD-10-CM | POA: Insufficient documentation

## 2016-01-01 DIAGNOSIS — S20219A Contusion of unspecified front wall of thorax, initial encounter: Secondary | ICD-10-CM

## 2016-01-01 DIAGNOSIS — S299XXA Unspecified injury of thorax, initial encounter: Secondary | ICD-10-CM | POA: Diagnosis present

## 2016-01-01 DIAGNOSIS — Y939 Activity, unspecified: Secondary | ICD-10-CM | POA: Insufficient documentation

## 2016-01-01 DIAGNOSIS — Z87891 Personal history of nicotine dependence: Secondary | ICD-10-CM | POA: Insufficient documentation

## 2016-01-01 NOTE — ED Triage Notes (Signed)
Unable to locate the pt
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
## 2016-01-01 NOTE — ED Notes (Signed)
Pt called to triage again, still no answer.

## 2016-01-01 NOTE — ED Triage Notes (Signed)
No answer

## 2016-01-01 NOTE — ED Notes (Signed)
Pt called to triage, no answer.

## 2016-01-02 MED ORDER — KETOROLAC TROMETHAMINE 60 MG/2ML IM SOLN
60.0000 mg | Freq: Once | INTRAMUSCULAR | Status: AC
  Administered 2016-01-02: 60 mg via INTRAMUSCULAR
  Filled 2016-01-02: qty 2

## 2016-01-02 MED ORDER — OXYCODONE-ACETAMINOPHEN 5-325 MG PO TABS
2.0000 | ORAL_TABLET | Freq: Once | ORAL | Status: AC
  Administered 2016-01-02: 2 via ORAL
  Filled 2016-01-02: qty 2

## 2016-01-02 MED ORDER — OXYCODONE-ACETAMINOPHEN 5-325 MG PO TABS: 1.0000 | ORAL_TABLET | Freq: Four times a day (QID) | ORAL | 0 refills | Status: AC | PRN

## 2016-01-02 MED ORDER — NAPROXEN 500 MG PO TABS: 500.0000 mg | ORAL_TABLET | Freq: Two times a day (BID) | ORAL | 0 refills | Status: AC

## 2016-01-02 MED ORDER — METHOCARBAMOL 500 MG PO TABS: 500.0000 mg | ORAL_TABLET | Freq: Two times a day (BID) | ORAL | 0 refills | Status: AC | PRN
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
## 2016-01-02 NOTE — Discharge Instructions (Signed)
Take Naproxen for pain and Robaxin for muscle spasms as needed. You may take Percocet as needed for severe pain. Apply ice to areas of injury 3-4 times per day for 15-20 minutes each time. Use an incentive spirometer at least once per hour while awake to ensure you are taking deep breaths. Your pain may worsen over the next 2 days. This is normal. You should have improvement in your symptoms by 1 week. Follow up with your primary care doctor to ensure resolution of symptoms.

## 2016-10-15 IMAGING — CT CT ABD-PELV W/ CM
2 of 5 series · 15 of 46 positions shown, 18 images · IV contrast (omnipaque)
Comparison: None.

CLINICAL DATA: Fall this morning. Vaginal bleeding since that time.

EXAM:
CT ABDOMEN AND PELVIS WITH CONTRAST
TECHNIQUE: Multidetector CT imaging of the abdomen and pelvis was performed
using the standard protocol following bolus administration of
intravenous contrast.
CONTRAST:  100mL OMNIPAQUE IOHEXOL 350 MG/ML SOLN

[Series 3: arterial 3.0 b30f · axial · arterial · 0.94mm/px · z∈[+922,+1390]mm · 12 of 174 slices shown, 15 images]
[im 12/174  soft-tissue]
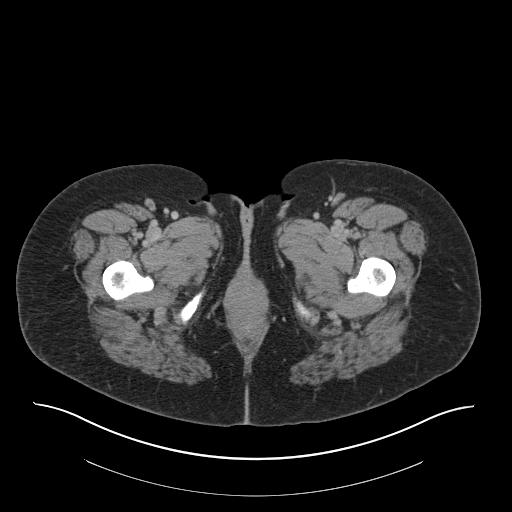
[im 12/174  bone]
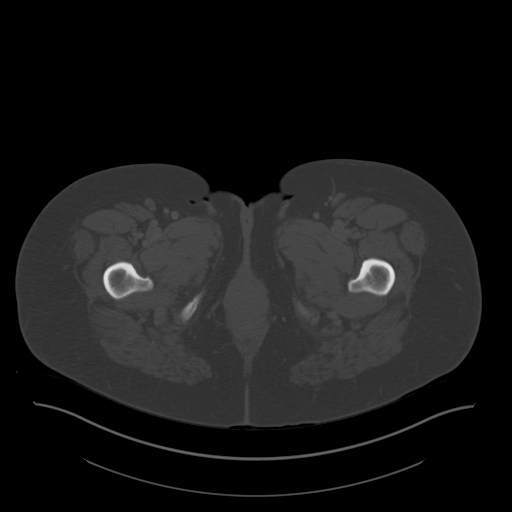
[im 34/174  soft-tissue]
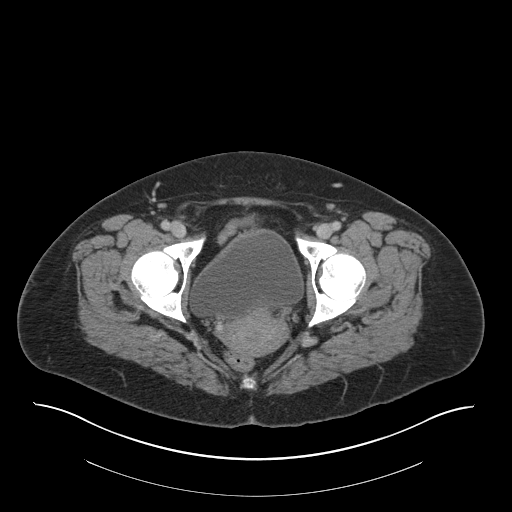
[im 51/174  soft-tissue]
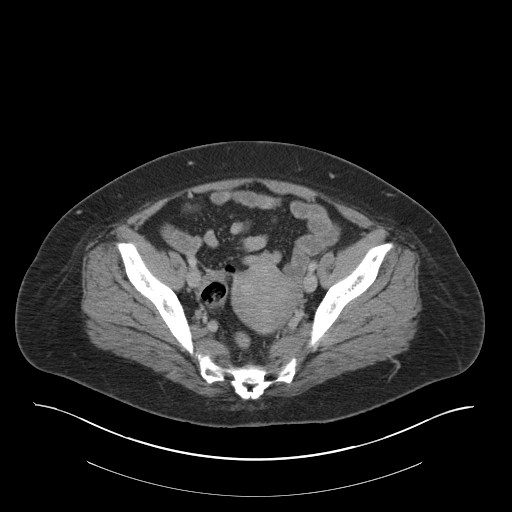
[im 67/174  soft-tissue]
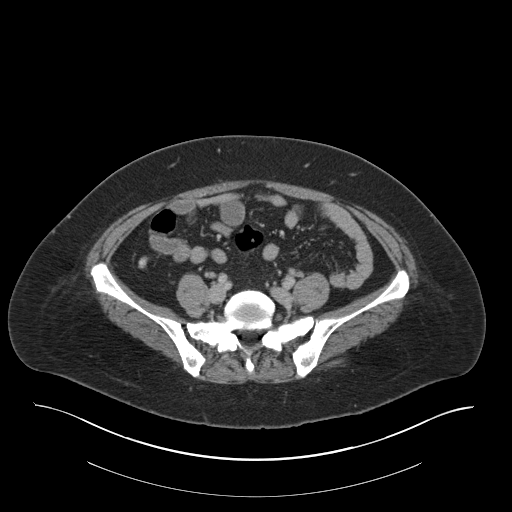
[im 90/174  soft-tissue]
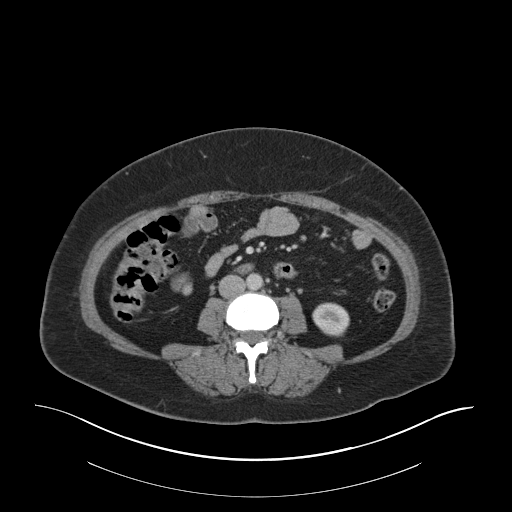
[im 107/174  soft-tissue]
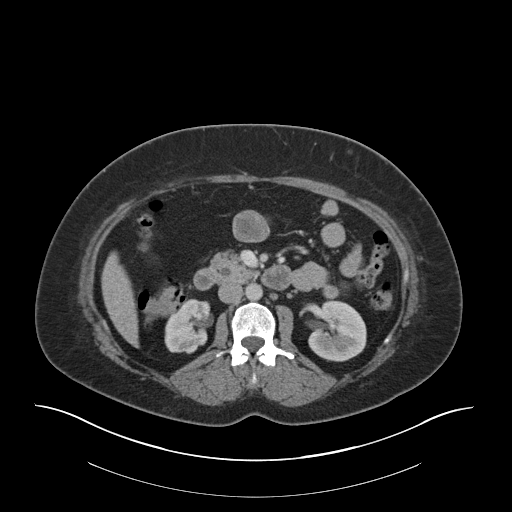
[im 123/174  soft-tissue]
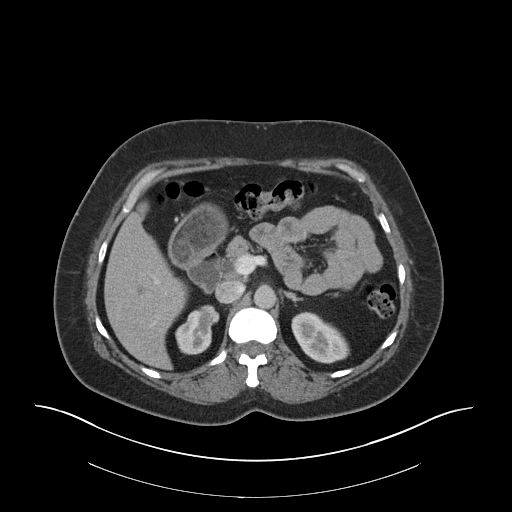
[im 146/174  soft-tissue]
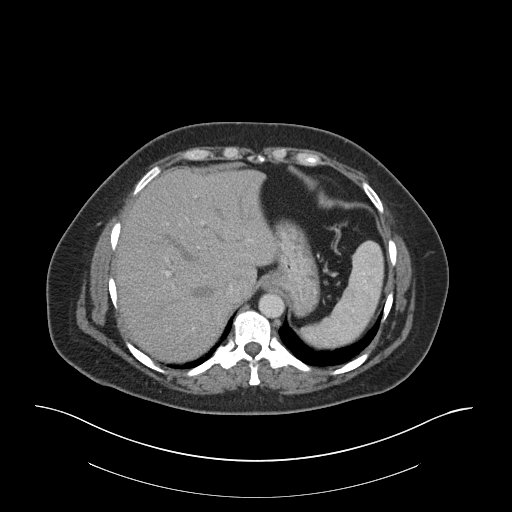
[im 151/174  lung]
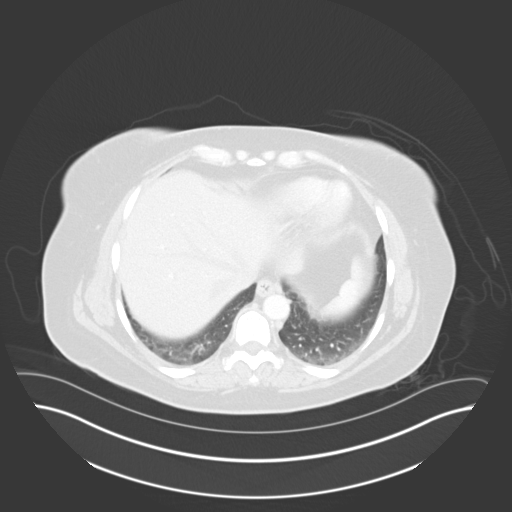
[im 157/174  lung]
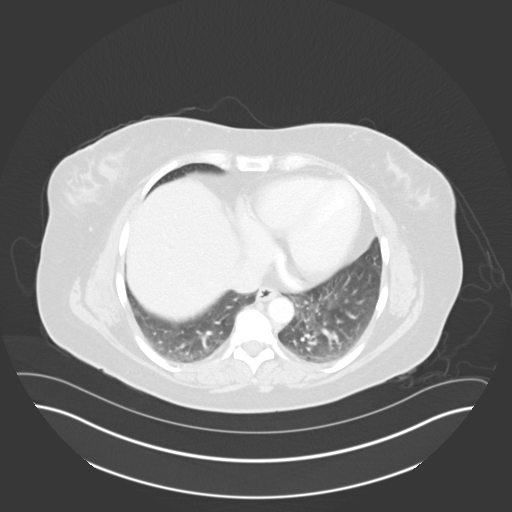
[im 162/174  soft-tissue]
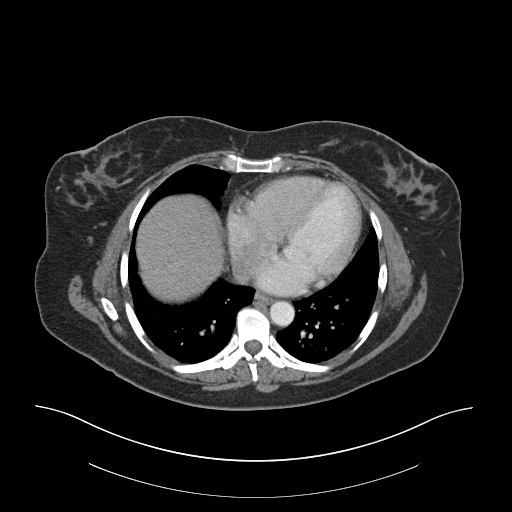
[im 162/174  lung]
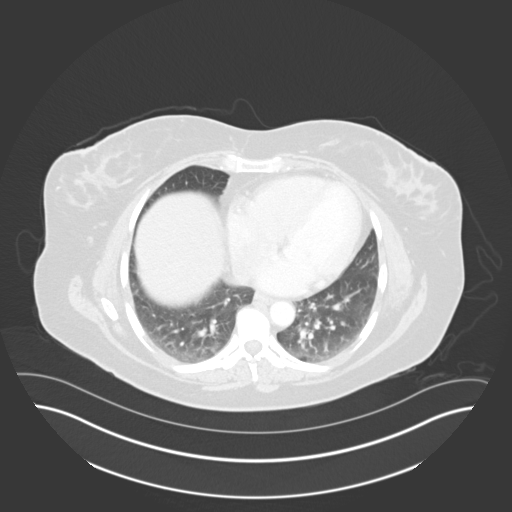
[im 162/174  bone]
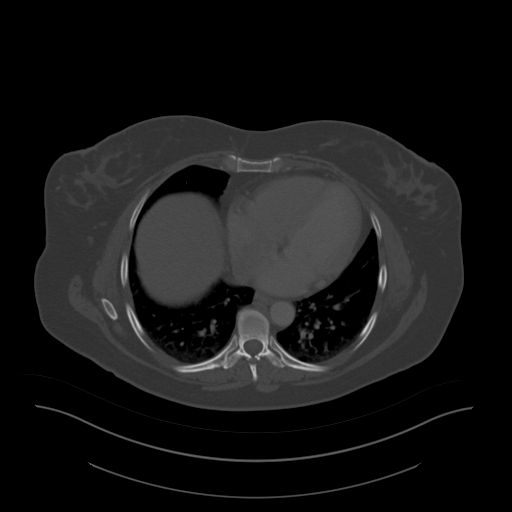
[im 168/174  lung]
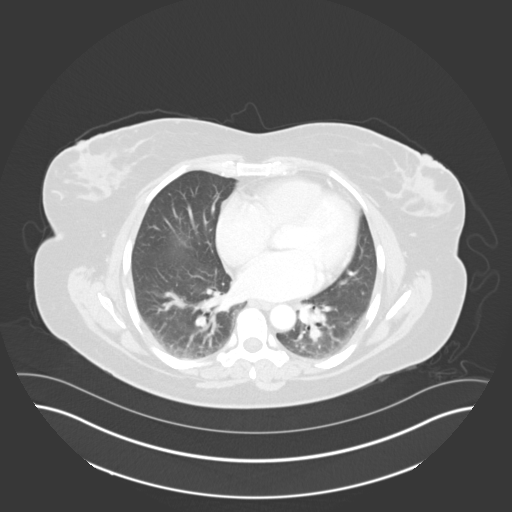

[Series 5: coronal · coronal · 0.82mm/px · 3 of 151 slices shown]
[im 38/151  soft-tissue]
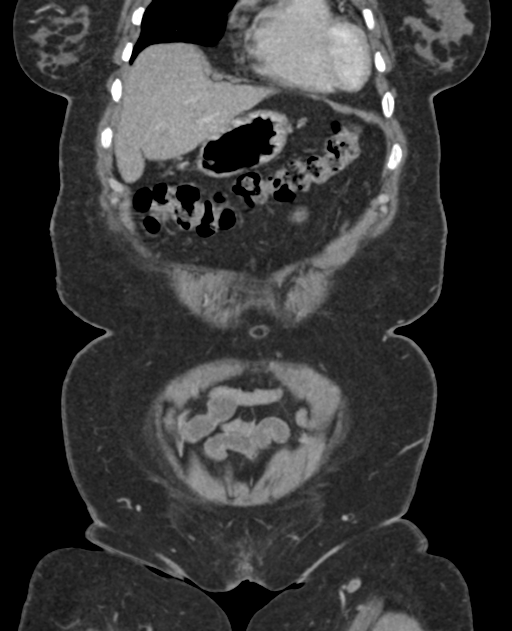
[im 76/151  soft-tissue]
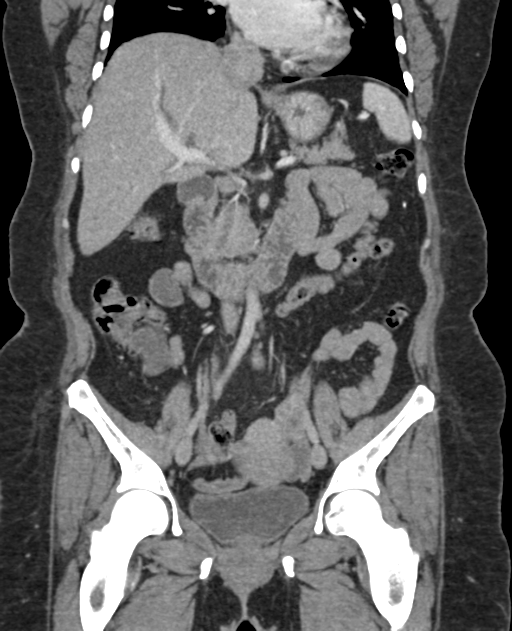
[im 113/151  soft-tissue]
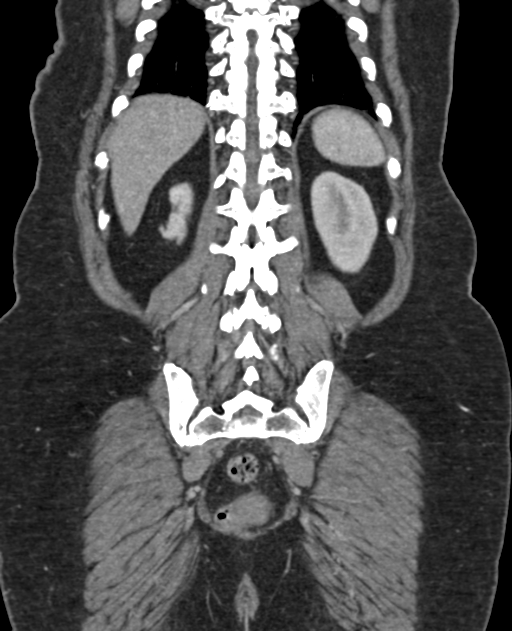

[15 of 46 positions shown; findings below may reference images not displayed]

FINDINGS: The lung bases are normal.

No free air or fluid. The liver and portal vein are normal. The
patient is status postcholecystectomy. Prominence of the bile ducts
is likely due to previous cholecystectomy. The spleen and right
adrenal gland are normal. There is a small nodule associated with
the left adrenal gland measuring 12 mm. This nodule does appear to
enhance. The left kidney is normal in appearance. The right kidney
demonstrates multiple areas of cortical thinning consistent with
previous infarcts or infections. No acute abnormality in the right
kidney. The pancreas is normal in appearance. The abdominal aorta is
non aneurysmal with no dissection. No adenopathy identified within
the abdomen. There is a fat containing umbilical hernia. The ventral
wall is otherwise intact. The stomach and small bowel are within
normal limits. The colon and appendix are normal.

There is a fibroid near the uterine fundus measuring up to 2.7 cm.
Another probable fibroid is seen posteriorly in the body of the
uterus on image 128 measuring 1 cm. No other uterine masses are
identified. The uterus is otherwise within normal limits. The right
ovary is normal in appearance. There appear to be 2 small cysts in
the left ovary seen on image 115, both measuring approximately 1 cm.
The bladder is within normal limits. Pelvic loops of bowel are
normal. No adenopathy seen in the pelvis.

Sclerotic foci in the pelvis are nonspecific but may be bone
islands. No other bony abnormalities are identified.
IMPRESSION: 1. There are 2 fibroids seen in the uterus. Uterus is otherwise
normal in appearance.
2. 2 1 cm cysts in the left ovary are of doubtful significance.
3. Cortical thinning throughout the right kidney consistent with
previous infarcts or infection.
4. 12 mm left adrenal nodule. This nodule does appear to enhance and
is nonspecific. MRI could further characterize.

Of note, arterial phase imaging was attempted but failed due to
technical difficulties.

## 2016-11-20 ENCOUNTER — Encounter: Payer: Self-pay | Admitting: Medical
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
## 2017-11-30 ENCOUNTER — Encounter: Payer: Self-pay | Admitting: Gastroenterology

## 2017-12-28 ENCOUNTER — Ambulatory Visit (AMBULATORY_SURGERY_CENTER): Payer: Self-pay

## 2017-12-28 ENCOUNTER — Other Ambulatory Visit: Payer: Self-pay

## 2017-12-28 VITALS — Ht 65.0 in | Wt 233.6 lb

## 2017-12-28 DIAGNOSIS — Z1211 Encounter for screening for malignant neoplasm of colon: Secondary | ICD-10-CM

## 2017-12-28 MED ORDER — NA SULFATE-K SULFATE-MG SULF 17.5-3.13-1.6 GM/177ML PO SOLN: 1.0000 | Freq: Once | ORAL | 0 refills | Status: AC

## 2017-12-28 NOTE — Progress Notes (Signed)
No egg or soy allergy known to patient  No issues with past sedation with any surgeries  or procedures, no intubation problems  No diet pills per patient No home 02 use per patient  No blood thinners per patient  Pt denies issues with constipation  No A fib or A flutter  EMMI video sent to pt's e mail , pt declined    

## 2017-12-29 ENCOUNTER — Encounter: Payer: Self-pay | Admitting: Gastroenterology

## 2018-01-11 ENCOUNTER — Encounter: Payer: Self-pay | Admitting: Gastroenterology

## 2018-01-11 ENCOUNTER — Ambulatory Visit (AMBULATORY_SURGERY_CENTER): Payer: BLUE CROSS/BLUE SHIELD | Admitting: Gastroenterology

## 2018-01-11 VITALS — BP 142/73 | HR 59 | Temp 97.3°F | Resp 10 | Ht 65.0 in | Wt 233.0 lb

## 2018-01-11 DIAGNOSIS — Z1211 Encounter for screening for malignant neoplasm of colon: Secondary | ICD-10-CM

## 2018-01-11 MED ORDER — SODIUM CHLORIDE 0.9 % IV SOLN: 500.0000 mL | Freq: Once | INTRAVENOUS | Status: DC

## 2018-01-11 NOTE — Patient Instructions (Signed)
Discharge instructions given. Normal exam. Resume previous medications. YOU HAD AN ENDOSCOPIC PROCEDURE TODAY AT THE [facility]:   Refer to the procedure report that was given to you for any specific questions about what was found during the examination.  If the procedure report does not answer your questions, please call your gastroenterologist to clarify.  If you requested that your care partner not be given the details of your procedure findings, then the procedure report has been included in a sealed envelope for you to review at your convenience later.  YOU SHOULD EXPECT: Some feelings of bloating in the abdomen. Passage of more gas than usual.  Walking can help get rid of the air that was put into your GI tract during the procedure and reduce the bloating. If you had a lower endoscopy (such as a colonoscopy or flexible sigmoidoscopy) you may notice spotting of blood in your stool or on the toilet paper. If you underwent a bowel prep for your procedure, you may not have a normal bowel movement for a few days.  Please Note:  You might notice some irritation and congestion in your nose or some drainage.  This is from the oxygen used during your procedure.  There is no need for concern and it should clear up in a day or so.  SYMPTOMS TO REPORT IMMEDIATELY:   Following lower endoscopy (colonoscopy or flexible sigmoidoscopy):  Excessive amounts of blood in the stool  Significant tenderness or worsening of abdominal pains  Swelling of the abdomen that is new, acute  Fever of 100F or higher   For urgent or emergent issues, a gastroenterologist can be reached at any hour by calling [phone].   DIET:  We do recommend a small meal at first, but then you may proceed to your regular diet.  Drink plenty of fluids but you should avoid alcoholic beverages for 24 hours.  ACTIVITY:  You should plan to take it easy for the rest of today and you should NOT DRIVE or use heavy machinery  until tomorrow (because of the sedation medicines used during the test).    FOLLOW UP: Our staff will call the number listed on your records the next business day following your procedure to check on you and address any questions or concerns that you may have regarding the information given to you following your procedure. If we do not reach you, we will leave a message.  However, if you are feeling well and you are not experiencing any problems, there is no need to return our call.  We will assume that you have returned to your regular daily activities without incident.  If any biopsies were taken you will be contacted by phone or by letter within the next 1-3 weeks.  Please call us at [phone] if you have not heard about the biopsies in 3 weeks.    SIGNATURES/CONFIDENTIALITY: You and/or your care partner have signed paperwork which will be entered into your electronic medical record.  These signatures attest to the fact that that the information above on your After Visit Summary has been reviewed and is understood.  Full responsibility of the confidentiality of this discharge information lies with you and/or your care-partner. 

## 2018-01-11 NOTE — Op Note (Signed)
[facility] Patient Name: Tara Ray Procedure Date: 01/11/2018 4:11 PM MRN: [id number] Endoscopist: Mallie Mussel L. Loletha Carrow , MD Age: 52 Referring MD:  Date of Birth: Jun 09, 1965 Gender: Female Account #: [id number] Procedure:                Colonoscopy Indications:              Screening for colorectal malignant neoplasm, This                            is the patient's first colonoscopy Medicines:                Monitored Anesthesia Care Procedure:                Pre-Anesthesia Assessment:                           - Prior to the procedure, a History and Physical                            was performed, and patient medications and                            allergies were reviewed. The patient's tolerance of                            previous anesthesia was also reviewed. The risks                            and benefits of the procedure and the sedation                            options and risks were discussed with the patient.                            All questions were answered, and informed consent                            was obtained. Prior Anticoagulants: The patient has                            taken no previous anticoagulant or antiplatelet                            agents. ASA Grade Assessment: II - A patient with                            mild systemic disease. After reviewing the risks                            and benefits, the patient was deemed in                            satisfactory condition to undergo the procedure.  After obtaining informed consent, the colonoscope                            was passed under direct vision. Throughout the                            procedure, the patient's blood pressure, pulse, and                            oxygen saturations were monitored continuously. The                            Colonoscope was introduced through the anus and                            advanced to the the cecum,  identified by                            appendiceal orifice and ileocecal valve. The                            colonoscopy was performed without difficulty. The                            patient tolerated the procedure well. The quality                            of the bowel preparation was good. The ileocecal                            valve, appendiceal orifice, and rectum were                            photographed. Scope In: 4:25:14 PM Scope Out: 7:06:23 PM Scope Withdrawal Time: 0 hours 8 minutes 23 seconds  Total Procedure Duration: 0 hours 11 minutes 45 seconds  Findings:                 The perianal and digital rectal examinations were                            normal.                           The entire examined colon appeared normal on direct                            and retroflexion views. Complications:            No immediate complications. Estimated Blood Loss:     Estimated blood loss: none. Impression:               - The entire examined colon is normal on direct and                            retroflexion views.                           -  No specimens collected. Recommendation:           - Patient has a contact number available for                            emergencies. The signs and symptoms of potential                            delayed complications were discussed with the                            patient. Return to normal activities tomorrow.                            Written discharge instructions were provided to the                            patient.                           - Resume previous diet.                           - Continue present medications.                           - Repeat colonoscopy in 10 years for screening                            purposes. Cavin Longman L. Loletha Carrow, MD 01/11/2018 4:41:32 PM This report has been signed electronically.

## 2018-01-11 NOTE — Progress Notes (Signed)
Report given to PACU, vss 

## 2018-01-12 ENCOUNTER — Telehealth: Payer: Self-pay

## 2018-01-12 NOTE — Telephone Encounter (Signed)
  Follow up Call-  Call back number 01/11/2018  Post procedure Call Back phone  # [phone]  Permission to leave phone message Yes  Some recent data might be hidden     Patient questions:  Do you have a fever, pain , or abdominal swelling? No. Pain Score  0 *  Have you tolerated food without any problems? Yes.    Have you been able to return to your normal activities? Yes.    Do you have any questions about your discharge instructions: Diet   No. Medications  No. Follow up visit  No.  Do you have questions or concerns about your Care? No.  Actions: * If pain score is 4 or above: No action needed, pain <4.

## 2018-08-22 ENCOUNTER — Emergency Department (HOSPITAL_COMMUNITY)
Admission: EM | Admit: 2018-08-22 | Discharge: 2018-08-22 | Disposition: A | Discharge status: Home / Self Care | Payer: BLUE CROSS/BLUE SHIELD | Attending: Emergency Medicine | Admitting: Emergency Medicine

## 2018-08-22 ENCOUNTER — Encounter (HOSPITAL_COMMUNITY): Payer: Self-pay

## 2018-08-22 ENCOUNTER — Other Ambulatory Visit: Payer: Self-pay

## 2018-08-22 DIAGNOSIS — R590 Localized enlarged lymph nodes: Secondary | ICD-10-CM | POA: Insufficient documentation

## 2018-08-22 DIAGNOSIS — Z87891 Personal history of nicotine dependence: Secondary | ICD-10-CM | POA: Insufficient documentation

## 2018-08-22 DIAGNOSIS — Z20828 Contact with and (suspected) exposure to other viral communicable diseases: Secondary | ICD-10-CM | POA: Diagnosis not present

## 2018-08-22 DIAGNOSIS — J029 Acute pharyngitis, unspecified: Secondary | ICD-10-CM | POA: Insufficient documentation

## 2018-08-22 DIAGNOSIS — Z853 Personal history of malignant neoplasm of breast: Secondary | ICD-10-CM | POA: Insufficient documentation

## 2018-08-22 LAB — GROUP A STREP BY PCR: Group A Strep by PCR: NOT DETECTED

## 2018-08-22 MED ORDER — AMOXICILLIN 500 MG PO CAPS: 500.0000 mg | ORAL_CAPSULE | Freq: Two times a day (BID) | ORAL | 0 refills | Status: AC

## 2018-08-22 MED ORDER — PREDNISONE 10 MG (21) PO TBPK: ORAL_TABLET | ORAL | 0 refills | Status: DC

## 2018-08-22 NOTE — ED Notes (Signed)
Used [employer] for interpreter services for spanish for triage and all other contacts with pt.

## 2018-08-22 NOTE — ED Notes (Signed)
Discharge papers provided in [place].

## 2018-08-22 NOTE — ED Notes (Signed)
Pt declined vitals recheck. Ambulatory and in no distress at d/c.

## 2018-08-22 NOTE — Discharge Instructions (Addendum)
Sore Throat  You have been seen today for sore throat.  The strep test was negative.  This usually indicates a viral infection.  Viral infections do not respond to antibiotics.  Your body has to fight off the infection and it needs to run its course. However, due to the duration of your symptoms, this may still be a bacterial infection.  If there is no improvement for two days of the prednisone and anti-inflammatory medications, then start the antibiotics. Hand washing: Wash your hands throughout the day, but especially before and after touching the face, using the restroom, sneezing, coughing, or touching surfaces that have been coughed or sneezed upon. Hydration: Symptoms will be intensified and complicated by dehydration. Dehydration can also extend the duration of symptoms. Drink plenty of fluids and get plenty of rest. You should be drinking at least half a liter of water an hour to stay hydrated. Electrolyte drinks (ex. Gatorade, Powerade, Pedialyte) are also encouraged. You should be drinking enough fluids to make your urine light yellow, almost clear. If this is not the case, you are not drinking enough water. Please note that some of the treatments indicated below will not be effective if you are not adequately hydrated. Diet: Please concentrate on hydration, however, you may introduce food slowly.  Start with a clear liquid diet, progressed to a full liquid diet, and then bland solids as you are able. Pain or fever: Ibuprofen, Naproxen, or Tylenol for pain or fever. (see below for suggested regimen) Antiinflammatory medications: Take 600 mg of ibuprofen every 6 hours or 440 mg (over the counter dose) to 500 mg (prescription dose) of naproxen every 12 hours for the next 3 days. After this time, these medications may be used as needed for pain. Take these medications with food to avoid upset stomach. Choose only one of these medications, do not take them together. Tylenol: Should you continue to  have additional pain while taking the ibuprofen or naproxen, you may add in tylenol as needed. Your daily total maximum amount of tylenol from all sources should be limited to 4000mg /day for persons without liver problems, or 2000mg /day for those with liver problems. Prednisone: Take the prednisone, as prescribed, until finished. Sore throat: Warm liquids or Chloraseptic spray may help soothe a sore throat. Gargle twice a day with a salt water solution made from a half teaspoon of salt in a cup of warm water.  Follow up: Follow up with a primary care provider, as needed, for any future management of this issue.  Alternatively, if symptoms fail to resolve, follow-up with ear nose and throat specialist.  For prescription assistance, may try using prescription discount sites or apps, such as goodrx.com

## 2018-08-22 NOTE — ED Provider Notes (Signed)
[facility] DE[facility] Provider Note   CSN: [id number] Arrival date & time: 08/22/18  1746    History   Chief Complaint Chief Complaint  Patient presents with  . Sore Throat    HPI Tara Ray is a 53 y.o. female.     HPI   Tara Ray is a 53 y.o. female, with a history of anemia, presenting to the ED with throat pain for the last 8 days. Pain is bilateral, described as a tightness/soreness, 7-8/10, nonradiating. 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 Has taken diclofenac, gargled with salt water.  Denies alcohol or tobacco use.  Also wonders if she could have a test for COVID-19 as her coworkers are making fun of her and her boss is getting suspicious. Denies fever/chills, nausea/vomiting, cough, nasal congestion, sinus pain, drooling, dental pain, chest pain, shortness of breath, or any other complaints.    Past Medical History:  Diagnosis Date  . Anemia   . Arthritis    knees  . Cancer [facility])    breast cancer left 2016    There are no active problems to display for this patient.   Past Surgical History:  Procedure Laterality Date  . BREAST LUMPECTOMY Left   . CHOLECYSTECTOMY    . KNEE SURGERY       OB History   No obstetric history on file.      Home Medications    Prior to Admission medications   Medication Sig Start Date End Date Taking? Authorizing Provider  amoxicillin (AMOXIL) 500 MG capsule Take 1 capsule (500 mg total) by mouth 2 (two) times daily for 10 days. 08/22/18 09/01/18  , Helane Gunther, PA-C  HYDROcodone-acetaminophen (NORCO) 5-325 MG tablet Take 1 tablet by mouth every 6 (six) hours as needed for moderate pain. 04/07/15   Kirichenko, Lahoma Rocker, PA-C  ibuprofen (ADVIL,MOTRIN) 200 MG tablet Take 400 mg by mouth every 6 (six) hours as needed for headache, mild pain or moderate pain.    [provider]  methocarbamol (ROBAXIN) 500 MG tablet Take  1 tablet (500 mg total) by mouth 2 (two) times daily as needed for muscle spasms. 01/02/16   Antonietta Breach, PA-C  naproxen (NAPROSYN) 500 MG tablet Take 1 tablet (500 mg total) by mouth 2 (two) times daily. Patient not taking: Reported on 01/11/2018 01/02/16   Antonietta Breach, PA-C  oxyCODONE-acetaminophen (PERCOCET/ROXICET) 5-325 MG tablet Take 1-2 tablets by mouth every 6 (six) hours as needed for severe pain. Patient not taking: Reported on 12/28/2017 01/02/16   Antonietta Breach, PA-C  predniSONE (STERAPRED UNI-PAK 21 TAB) 10 MG (21) TBPK tablet Take 6 tabs (60mg ) day 1, 5 tabs (50mg ) day 2, 4 tabs (40mg ) day 3, 3 tabs (30mg ) day 4, 2 tabs (20mg ) day 5, and 1 tab (10mg ) day 6. 08/22/18   , Helane Gunther, PA-C    Family History Family History  Problem Relation Age of Onset  . Hypertension Mother   . Stomach cancer Maternal Aunt   . Stomach cancer Maternal Aunt   . Colon cancer Neg Hx   . Esophageal cancer Neg Hx   . Rectal cancer Neg Hx     Social History Social History   Tobacco Use  . Smoking status: Former Research scientist (life sciences)  . Smokeless tobacco: Never Used  . Tobacco comment: quit over 18 years ago  Substance Use Topics  . Alcohol use: No    Comment: for greater  than 7 years  . Drug use: Never     Allergies   Aspirin and Shellfish allergy   Review of Systems Review of Systems  Constitutional: Negative for chills and fever.  HENT: Positive for sore throat. Negative for congestion, drooling, rhinorrhea, sinus pressure, sinus pain, trouble swallowing and voice change.   Respiratory: Negative for cough and shortness of breath.   Cardiovascular: Negative for chest pain.  Gastrointestinal: Negative for abdominal pain, diarrhea, nausea and vomiting.  Musculoskeletal: Negative for neck stiffness.  All other systems reviewed and are negative.    Physical Exam Updated Vital Signs BP (!) 157/87 (BP Location: Left Arm)   Pulse 81   Temp 98.6 F (37 C) (Oral)   Resp 18   Ht 5\' 5"  (1.651 m)    Wt 104.3 kg   SpO2 98%   BMI 38.27 kg/m   Physical Exam Vitals signs and nursing note reviewed.  Constitutional:      General: She is not in acute distress.    Appearance: She is well-developed. She is not diaphoretic.  HENT:     Head: Normocephalic and atraumatic.     Mouth/Throat:     Mouth: Mucous membranes are moist.     Pharynx: Oropharynx is clear.     Comments: Dentition appears to be intact and stable.  No noted area of swelling or fluctuance.  No trismus or noted abnormal phonation.  Mouth opening to at least 3 finger widths.  Handles oral secretions without difficulty.  No noted facial swelling.  No sublingual swelling.  No swelling or tenderness to the submental or submandibular regions.   Eyes:     Conjunctiva/sclera: Conjunctivae normal.  Neck:     Musculoskeletal: Neck supple.   Cardiovascular:     Rate and Rhythm: Normal rate and regular rhythm.     Pulses: Normal pulses.          Radial pulses are 2+ on the right side and 2+ on the left side.       Posterior tibial pulses are 2+ on the right side and 2+ on the left side.     Heart sounds: Normal heart sounds.     Comments: Tactile temperature in the extremities appropriate and equal bilaterally. Pulmonary:     Effort: Pulmonary effort is normal. No respiratory distress.     Breath sounds: Normal breath sounds.  Abdominal:     Palpations: Abdomen is soft.     Tenderness: There is no abdominal tenderness. There is no guarding.  Musculoskeletal:     Right lower leg: No edema.     Left lower leg: No edema.  Lymphadenopathy:     Cervical: Cervical adenopathy present.  Skin:    General: Skin is warm and dry.  Neurological:     Mental Status: She is alert.  Psychiatric:        Mood and Affect: Mood and affect normal.        Speech: Speech normal.        Behavior: Behavior normal.      ED Treatments / Results  Labs (all labs ordered are listed, but only abnormal results are displayed) Labs Reviewed   GROUP A STREP BY PCR  NOVEL CORONAVIRUS, NAA (HOSPITAL ORDER, SEND-OUT TO REF LAB)    EKG None  Radiology No results found.  Procedures Procedures (including critical care time)  Medications Ordered in ED Medications - No data to display   Initial Impression / Assessment and Plan / ED Course  I have reviewed the triage vital signs and the nursing notes.  Pertinent labs & imaging results that were available during my care of the patient were reviewed by me and considered in my medical decision making (see chart for details).        Patient presents with sore throat/throat pain for the last 8 days.  No red flag symptoms on exam.  Low suspicion for Ludwig's angioedema.  Patient is nontoxic appearing, afebrile, not tachycardic, not tachypneic, not hypotensive, maintains excellent SPO2 on room air, and is in no apparent distress.  This may still be laryngitis or viral pharyngitis.  Irritation from cleaning products is also a possibility. I have given the patient a detailed regimen that includes several days of anti-inflammatory medications and prednisone.  If her symptoms fail to start to improve, she will initiate the antibiotic.  I have also given her ENT referral to be used as needed. The patient was given instructions for home care as well as return precautions. Patient voices understanding of these instructions, accepts the plan, and is comfortable with discharge.   Danali Raiya Stainback was evaluated in Emergency Department on 08/23/2018 for the symptoms described in the history of present illness. She was evaluated in the context of the global COVID-19 pandemic, which necessitated consideration that the patient might be at risk for infection with the SARS-CoV-2 virus that causes COVID-19. Institutional protocols and algorithms that pertain to the evaluation of patients at risk for COVID-19 are in a state of rapid change based on information released by regulatory bodies including the  CDC and federal and state organizations. These policies and algorithms were followed during the patient's care in the ED.    Final Clinical Impressions(s) / ED Diagnoses   Final diagnoses:  Sore throat    ED Discharge Orders         Ordered    amoxicillin (AMOXIL) 500 MG capsule  2 times daily     08/22/18 1947    predniSONE (STERAPRED UNI-PAK 21 TAB) 10 MG (21) TBPK tablet     08/22/18 1947           Lorayne Bender, PA-C 08/23/18 Priscella Mann, MD 09/03/18 1256

## 2018-08-22 NOTE — ED Triage Notes (Signed)
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

## 2018-08-24 LAB — NOVEL CORONAVIRUS, NAA (HOSP ORDER, SEND-OUT TO REF LAB; TAT 18-24 HRS): SARS-CoV-2, NAA: NOT DETECTED

## 2018-09-01 ENCOUNTER — Ambulatory Visit: Payer: BLUE CROSS/BLUE SHIELD | Attending: Family Medicine | Admitting: Family Medicine

## 2018-09-01 ENCOUNTER — Other Ambulatory Visit: Payer: Self-pay

## 2018-09-01 ENCOUNTER — Encounter: Payer: Self-pay | Admitting: Family Medicine

## 2018-09-01 VITALS — BP 148/85 | HR 74 | Temp 98.8°F | Ht 66.0 in | Wt 230.2 lb

## 2018-09-01 DIAGNOSIS — F4320 Adjustment disorder, unspecified: Secondary | ICD-10-CM

## 2018-09-01 DIAGNOSIS — R7303 Prediabetes: Secondary | ICD-10-CM | POA: Diagnosis not present

## 2018-09-01 DIAGNOSIS — J029 Acute pharyngitis, unspecified: Secondary | ICD-10-CM | POA: Diagnosis not present

## 2018-09-01 DIAGNOSIS — M2550 Pain in unspecified joint: Secondary | ICD-10-CM

## 2018-09-01 DIAGNOSIS — J3089 Other allergic rhinitis: Secondary | ICD-10-CM

## 2018-09-01 DIAGNOSIS — T6591XA Toxic effect of unspecified substance, accidental (unintentional), initial encounter: Secondary | ICD-10-CM | POA: Diagnosis not present

## 2018-09-01 DIAGNOSIS — Z09 Encounter for follow-up examination after completed treatment for conditions other than malignant neoplasm: Secondary | ICD-10-CM

## 2018-09-01 DIAGNOSIS — E785 Hyperlipidemia, unspecified: Secondary | ICD-10-CM

## 2018-09-01 DIAGNOSIS — Z91013 Allergy to seafood: Secondary | ICD-10-CM

## 2018-09-01 DIAGNOSIS — F4321 Adjustment disorder with depressed mood: Secondary | ICD-10-CM

## 2018-09-01 LAB — POCT GLYCOSYLATED HEMOGLOBIN (HGB A1C): Hemoglobin A1C: 6.8 % — AB (ref 4.0–5.6)

## 2018-09-01 LAB — GLUCOSE, POCT (MANUAL RESULT ENTRY): POC Glucose: 161 mg/dL — AB (ref 70–99)

## 2018-09-01 MED ORDER — TRAZODONE HCL 50 MG PO TABS: 25.0000 mg | ORAL_TABLET | Freq: Every evening | ORAL | 3 refills | Status: DC | PRN

## 2018-09-01 MED ORDER — EPINEPHRINE 0.3 MG/0.3ML IJ SOAJ: 0.3000 mg | INTRAMUSCULAR | 99 refills | Status: AC | PRN

## 2018-09-01 MED ORDER — IBUPROFEN 600 MG PO TABS: 600.0000 mg | ORAL_TABLET | Freq: Three times a day (TID) | ORAL | 3 refills | Status: AC | PRN

## 2018-09-01 MED ORDER — LORATADINE 10 MG PO TABS: 10.0000 mg | ORAL_TABLET | Freq: Every day | ORAL | 3 refills | Status: DC

## 2018-09-01 MED ORDER — METFORMIN HCL 1000 MG PO TABS: 1000.0000 mg | ORAL_TABLET | Freq: Two times a day (BID) | ORAL | 1 refills | Status: DC

## 2018-09-01 MED ORDER — ATORVASTATIN CALCIUM 10 MG PO TABS: 10.0000 mg | ORAL_TABLET | Freq: Every day | ORAL | 1 refills | Status: AC

## 2018-09-01 NOTE — Progress Notes (Signed)
Patient stated she is allergic to metal powder at her job and she did not bring her medication bottle today.

## 2018-09-01 NOTE — Progress Notes (Signed)
Subjective:  Patient ID: Tara Ray, female    DOB: Jun 26, 1965  Age: 53 y.o. MRN: [id number]  CC: New Patient (Initial Visit)  Due to language barrier, patient is accompanied by an in person Spanish interpreter  HPI Tara Ray presents to establish care as a new patient.  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  She states that a few weeks ago she had onset of throat irritation and hoarseness.  She reports that her employers were afraid that she might have COVID-19 and they had her go to urgent care.  Patient states that she was negative for COVID-19 and was treated for pharyngitis.  Patient states that the steroids that were prescribed did help with her throat pain.  She feels now as if she only has a slight scratchy throat.  She reports that her prior primary care provider left the practice therefore she needed to find a new primary care provider.  She states that she has been on allergy medication long-term which she is currently out of and she believes that she developed the sore throat and hoarseness due to lack of her allergy medicine as well as possible increased throat and airway irritation from inhaling and being exposed to metal particles from her workplace.  She reports that she sometimes gets itching of her arms at her workplace due to exposure to the metal particles.  She also has past history of allergy to aspirin and shellfish and has had to go to the emergency department in the past due to an allergic reaction.  She reports with aspirin that she gets sensation of shortness of breath and throat swelling and also with shellfish she develops hives as well as some shortness of breath.  Patient reports that she can take ibuprofen and she would like to have a refill of ibuprofen today to take as needed for knee pain.      Patient's past medical history per patient is significant for prediabetes but she states that she has been out of her  metformin for the past 2 weeks and has been out of medication for high cholesterol as well as her daily allergy medication.  Medical assistant contacted patient's prior primary care office and patient was previously on metformin thousand milligrams twice daily, atorvastatin 10 mg daily and loratadine 10 mg daily.  Patient denies any increased thirst and no urinary frequency.  She again insists that she is prediabetic and not diabetic.  Patient initially stated that she was fasting but then recalled that she had sugared water with cucumbers this morning prior to her appointment.  She also recently learned that her oldest daughter and unborn grandchild passed away unexpectedly during her daughter's pregnancy.  She believes that this is why her blood pressure is elevated at today's visit as her daughter's funeral was yesterday and patient has been unable to sleep well due to grief.  Past Medical History:  Diagnosis Date  . Anemia   . Arthritis    knees  . Cancer [facility])    breast cancer left 2016    Past Surgical History:  Procedure Laterality Date  . BREAST LUMPECTOMY Left   . CHOLECYSTECTOMY    . KNEE SURGERY      Family History  Problem Relation Age of Onset  . Hypertension Mother   . Stomach cancer Maternal Aunt   . Stomach cancer Maternal Aunt   . Colon cancer Neg Hx   . Esophageal cancer Neg  Hx   . Rectal cancer Neg Hx     Social History   Tobacco Use  . Smoking status: Former Research scientist (life sciences)  . Smokeless tobacco: Never Used  . Tobacco comment: quit over 18 years ago  Substance Use Topics  . Alcohol use: No    Comment: for greater than 7 years    ROS Review of Systems  Constitutional: Positive for fatigue. Negative for chills and fever.  HENT: Positive for congestion and rhinorrhea. Negative for nosebleeds.   Eyes: Negative for photophobia and visual disturbance.  Respiratory: Negative for cough and shortness of breath.   Cardiovascular: Negative for chest pain and palpitations.   Gastrointestinal: Negative for abdominal pain, constipation, diarrhea and nausea.  Endocrine: Negative for polydipsia, polyphagia and polyuria.  Genitourinary: Negative for dysuria and frequency.  Musculoskeletal: Positive for arthralgias. Negative for back pain.  Skin: Negative for rash and wound.       No current skin issues but occasionally has hives  Allergic/Immunologic: Positive for environmental allergies and food allergies.  Neurological: Negative for dizziness and headaches.  Hematological: Negative for adenopathy. Does not bruise/bleed easily.  Psychiatric/Behavioral: Positive for sleep disturbance. Negative for self-injury and suicidal ideas. The patient is nervous/anxious.     Objective:   Today's Vitals: BP (!) 148/85 (BP Location: Right Arm, Patient Position: Sitting, Cuff Size: Large)   Pulse 74   Temp 98.8 F (37.1 C) (Oral)   Ht 5\' 6"  (1.676 m)   Wt 230 lb 3.2 oz (104.4 kg)   LMP 07/28/2017   SpO2 95%   BMI 37.16 kg/m   Physical Exam Vitals signs and nursing note reviewed.  Constitutional:      General: She is not in acute distress.    Appearance: Normal appearance. She is not ill-appearing.  HENT:     Right Ear: Tympanic membrane normal.     Left Ear: Tympanic membrane normal.     Nose: Congestion (Pale edematous nasal turbinates) and rhinorrhea (Mild clear to light white nasal discharge) present.     Mouth/Throat:     Mouth: Mucous membranes are moist.     Pharynx: Posterior oropharyngeal erythema present. No oropharyngeal exudate.     Comments: Mild posterior pharynx erythema/cobblestoning Eyes:     Extraocular Movements: Extraocular movements intact.     Conjunctiva/sclera: Conjunctivae normal.  Neck:     Musculoskeletal: Normal range of motion and neck supple. No muscular tenderness.     Vascular: No carotid bruit.  Cardiovascular:     Rate and Rhythm: Normal rate and regular rhythm.  Pulmonary:     Effort: Pulmonary effort is normal. No  respiratory distress.     Breath sounds: Normal breath sounds. No wheezing or rhonchi.  Abdominal:     Palpations: Abdomen is soft.     Tenderness: There is no abdominal tenderness. There is no right CVA tenderness, left CVA tenderness, guarding or rebound.  Musculoskeletal:        General: No tenderness or deformity.     Right lower leg: No edema.     Left lower leg: No edema.  Lymphadenopathy:     Cervical: No cervical adenopathy.  Skin:    General: Skin is warm and dry.  Neurological:     General: No focal deficit present.     Mental Status: She is alert and oriented to person, place, and time.  Psychiatric:        Behavior: Behavior normal.        Thought Content: Thought content normal.  Judgment: Judgment normal.     Comments: Patient with mild tearfulness when talking about her daughter's recent death     Assessment & Plan:  1. Shellfish allergy Patient with allergy for shellfish that has required emergency department visits in the past after an allergic reaction.  Prescription provided for EpiPen to use if needed for severe allergic reaction/anaphylaxis.  Patient was also made aware that with use of EpiPen she would still need to follow-up at the emergency department for further evaluation after use - EPINEPHrine (EPIPEN 2-PAK) 0.3 mg/0.3 mL IJ SOAJ injection; Inject 0.3 mLs (0.3 mg total) into the muscle as needed for anaphylaxis.  Dispense: 1 each; Refill: prn - Ambulatory referral to Allergy  2. Prediabetes Patient with history of prediabetes and will have hemoglobin A1c at today's visit as well as CMP.  Patient has been taking metformin thousand milligrams twice daily and I suspect that she actually was at some point classified as diabetic though patient denies past diagnosis of diabetes.  Hemoglobin A1c will be done with today's labs.  Patient is encouraged to continue her current medications and to adopt a low-fat/low carbohydrate diet and to continue regular  exercise.  Discussed monitoring blood sugars and patient will consider and contact the office if she would like to have testing supplies and glucometer. - HgB A1c - Comprehensive metabolic panel - metFORMIN (GLUCOPHAGE) 1000 MG tablet; Take 1 tablet (1,000 mg total) by mouth 2 (two) times daily.  Dispense: 180 tablet; Refill: 1 - Glucose (CBG)  3. Allergic rhinitis due to other allergic trigger, unspecified seasonality Patient reports long-term issues with allergic rhinitis as well as occasional development of hives.  Patient has taken loratadine in the past which helped with the symptoms and refill provided. - loratadine (CLARITIN) 10 MG tablet; Take 1 tablet (10 mg total) by mouth daily.  Dispense: 90 tablet; Refill: 3  4. Hyperlipidemia LDL goal <70 Patient with hyperlipidemia and has been on atorvastatin 10 mg daily.  Lipid panel will be repeated at today's visit as well as CMP in follow-up of long-term use of statin medication. - Lipid panel - atorvastatin (LIPITOR) 10 MG tablet; Take 1 tablet (10 mg total) by mouth daily. To lower cholesterol  Dispense: 90 tablet; Refill: 1  5. Allergic reaction to chemical substance, accidental or unintentional, initial encounter; pharyngitis; encounter for examination following treatment at hospital Patient reports onset of scratchy throat and occasional sensation of shortness of breath with exposure to substances at her workplace.  She is not sure if she is having a reaction to the metal particles that are in the air or other chemicals used in housekeeping.  Patient agrees to be referred to an allergist for further evaluation and treatment.  Patient's emergency department note from 08/22/2018 reviewed and patient did have negative test for COVID 19 and evaluation of her throat discomfort/scratchy throat.  Patient was prescribed prednisone pack and patient reports improvement in her sore throat.  Patient sore throat/pharyngitis was also likely related to her  allergic rhinitis. - Ambulatory referral to Allergy  6. Arthralgia, unspecified joint Patient requests prescription for ibuprofen to help with joint pain.  Patient does have an aspirin allergy but states that she can take ibuprofen without any difficulty.  She is encouraged to eat prior to taking ibuprofen to help avoid stomach upset/GI bleed and also made aware of increased risk of cardiovascular event and renal damage with use of nonsteroidal anti-inflammatories. - ibuprofen (ADVIL) 600 MG tablet; Take 1 tablet (600 mg total)  by mouth every 8 (eight) hours as needed for moderate pain.  Dispense: 60 tablet; Refill: 3  7. Grief reaction She reports that she found out this week that her pregnant daughter and unborn child passed away unexpectedly.  Patient has had difficulty sleeping and had recurrent crying and she believes that this is why her blood pressure is elevated at today's visit.  Patient is provided with a prescription for trazodone 50 mg to take one half or 1 tablet at bedtime to help with sleep.  Call or return of symptoms or not improving or if referral for counseling needed. - traZODone (DESYREL) 50 MG tablet; Take 0.5-1 tablets (25-50 mg total) by mouth at bedtime as needed for sleep.  Dispense: 30 tablet; Refill: 3  Outpatient Encounter Medications as of 09/01/2018  Medication Sig  . amoxicillin (AMOXIL) 500 MG capsule Take 1 capsule (500 mg total) by mouth 2 (two) times daily for 10 days.  Marland Kitchen ibuprofen (ADVIL,MOTRIN) 200 MG tablet Take 400 mg by mouth every 6 (six) hours as needed for headache, mild pain or moderate pain.  . predniSONE (STERAPRED UNI-PAK 21 TAB) 10 MG (21) TBPK tablet Take 6 tabs (60mg ) day 1, 5 tabs (50mg ) day 2, 4 tabs (40mg ) day 3, 3 tabs (30mg ) day 4, 2 tabs (20mg ) day 5, and 1 tab (10mg ) day 6.  . HYDROcodone-acetaminophen (NORCO) 5-325 MG tablet Take 1 tablet by mouth every 6 (six) hours as needed for moderate pain. (Patient not taking: Reported on 09/01/2018)  .  methocarbamol (ROBAXIN) 500 MG tablet Take 1 tablet (500 mg total) by mouth 2 (two) times daily as needed for muscle spasms. (Patient not taking: Reported on 09/01/2018)  . naproxen (NAPROSYN) 500 MG tablet Take 1 tablet (500 mg total) by mouth 2 (two) times daily. (Patient not taking: Reported on 01/11/2018)  . oxyCODONE-acetaminophen (PERCOCET/ROXICET) 5-325 MG tablet Take 1-2 tablets by mouth every 6 (six) hours as needed for severe pain. (Patient not taking: Reported on 12/28/2017)   No facility-administered encounter medications on file as of 09/01/2018.    An After Visit Summary was printed and given to the patient.  Follow-up: Return in about 6 weeks (around 10/13/2018) for follow-up of blood sugar.   Antony Blackbird MD

## 2018-09-01 NOTE — Patient Instructions (Signed)
Informacin bsica sobre la diabetes Diabetes Basics  La diabetes (diabetes mellitus) es una enfermedad de larga duracin (crnica). Se produce cuando el cuerpo no utiliza Occupational hygienist (glucosa) que se libera de los alimentos despus de comer. La diabetes puede deberse a uno de [employer] o a ambos:  El pncreas no produce suficiente cantidad de una hormona llamada insulina.  El cuerpo no reacciona de forma normal a la insulina que produce. La insulina permite que ciertos azcares (glucosa) ingresen a las clulas del cuerpo. Esto le proporciona energa. Si tiene diabetes, los azcares no pueden ingresar a las clulas. Esto produce un aumento del nivel de Dispensing optician (hiperglucemia). Sigue estas instrucciones en tu casa: Cmo se trata la diabetes? Es posible que tenga que administrarse insulina u otros medicamentos para la diabetes todos los [place] para mantener el nivel de Location manager en la sangre equilibrado. Adminstrese los medicamentos para la diabetes todos los [place] se lo haya indicado el mdico. Haga una lista de los medicamentos para la diabetes aqu: Medicamentos para la diabetes  Nombre del medicamento: ______________________________ ? Cantidad (dosis): ________________ Mellody Drown (a.m./p.m.): _______________ Wilford Grist: ___________________________________  Micki Riley medicamento: ______________________________ ? Cantidad (dosis): ________________ Mellody Drown (a.m./p.m.): _______________ Wilford Grist: ___________________________________  Micki Riley medicamento: ______________________________ ? Cantidad (dosis): ________________ Mellody Drown (a.m./p.m.): _______________ Wilford Grist: ___________________________________ Si [place] insulina, aprender cmo aplicrsela con inyecciones. Es posible que deba ajustar la cantidad en funcin de los alimentos que coma. Haga una lista de los tipos de [place] aqu: Insulina  Tipo de insulina: ______________________________ ? Cantidad (dosis):  ________________ Mellody Drown (a.m./p.m.): _______________ Wilford Grist: ___________________________________  Suezanne Jacquet: ______________________________ ? Cantidad (dosis): ________________ Mellody Drown (a.m./p.m.): _______________ Wilford Grist: ___________________________________  Suezanne Jacquet: ______________________________ ? Cantidad (dosis): ________________ Mellody Drown (a.m./p.m.): _______________ Wilford Grist: ___________________________________  Suezanne Jacquet: ______________________________ ? Cantidad (dosis): ________________ Mellody Drown (a.m./p.m.): _______________ Wilford Grist: ___________________________________  Suezanne Jacquet: ______________________________ ? Cantidad (dosis): ________________ Mellody Drown (a.m./p.m.): _______________ Wilford Grist: ___________________________________ Cmo me controlo el nivel de azcar en la sangre?  Controle sus niveles de azcar en la sangre con un medidor de glucemia segn las indicaciones del mdico. El mdico fijar los objetivos del tratamiento para usted. Generalmente, los resultados de los niveles de azcar en la sangre deben ser los siguientes:  Antes de las comidas (preprandial): de 80 a 133m/dl (de 4,4 a 7,284ml/l).  Despus de las comidas (posprandial): por debajo de 18049ml (52m65ml).  Nivel de A1c: menos del 7%. Anote las veces que se controlar los niveles de azcar en la sangre: Controles de azcar en la sangre  Hora: _______________ NotaWilford Grist_________________________________  HoraMellody Drown_____________ Notas: ___________________________________  Hora: _______________ Notas: ___________________________________  Hora: _______________ Notas: ___________________________________  Hora: _______________ Notas: ___________________________________  Hora: _______________ Notas: ___________________________________  Qu dSander Nephewo saber acerca del nivel bajo de azcar en la sangre? Un nivel bajo de azcar en la sangre se denomina hipoglucemia. Este cuadro ocurre cuando el  nivel de azcar en la sangre es igual o menor que 70mg71m(3,9mmol51m. Entre los sntomas, se pueden incluir los siguientes:  Sentir: ? HambreMount Enterpriseeocupacin o nervios (ansiedad). ? Sudoracin y piel hIntel Corporationnfusin. ? Mareos. ? Somnolencia. ? Ganas de vomitar (nuseas).  Tener: ? Latidos cardacos acelerados. ? Dolor de cabezaNetherlandsmbios en la visin. ? Hormigueo y falta de sensibilidad (entumecimiento) alrededor de la boca, los labios o la lenguaHawkinsvillevimientos espasmdicos que no puede controlar (convulsiones).  Dificultades para hacer lo siguiente: ? Moverse (coordinacin). ? Dormir. ? Desmayos. ? Molestarse con facilidad (irritabilidad). Tratamiento del nivelCactus  bajo de azcar en la sangre Para tratar un nivel bajo de azcar en la sangre, ingiera un alimento o una bebida azucarada de inmediato. Si puede pensar con claridad y tragar de manera segura, siga la regla 15/15, que consiste en lo siguiente:  Consuma 15gramos de un hidrato de carbono de accin rpida (carbohidrato). Hable con su mdico acerca de cunto debera consumir.  Algunos hidratos de carbono de accin rpida son: ? Comprimidos de azcar (pastillas de glucosa). Consuma 3o 4pastillas de glucosa. ? De 6 a 8unidades de caramelos duros. ? De 4 a 6onzas (de 120 a 145m) de jugo de frutas. ? De 4 a 6onzas (de 120 a 1573m de refresco comn (no diettico). ? 1 cucharada (1552mde miel o azcar.  Contrlese el nivel de azcar en la sangre 61m41mos despus de ingerir el hidrato de carbono.  Si el nivel de azcar en la sangre todava es igual o menor que 70mg16m(3,9mmol52m, ingiera nuevamente 15gramos de un hidrato de carbono.  Si el nivel de azcar en la sangre no supera los 70mg/d105m,9mmol/l89mespus de 3intentos, solicite ayuda de inmediato.  Ingiera una comida o una colacin en el transcurso de 1hora despus de que el nivel de azcar en la sangre se haya normalizado. Tratamiento del nivel  muy bajo de azcar en la sangre Si el nivel de azcar en la sangre es igual o menor que 54mg/dl 27mol/l),47mgnifica que est muy bajo (hipoglucemia grave). Esto es una emergeEngineer, maintenance (IT)e a ver si los sntomas desaparecen. Solicite atencin mdica de inmediato. Comunquese con el servicio de emergencias de su localidad (911 en los [place]). No conduzca por sus propios medios hasta el hGoldman Sachs Preguntas para hacerle al mdico  Es necesario que me rena con un instrucRadio broadcast assistantdado de la diabetes?  Qu equipos necesitar para cuidarme en casa?  Qu medicamentos para la diabetes necesito? Cundo debo tomarlos?  Con qu frecuencia debo controlar mi nivel de azcar en la sangre?  A qu nmero puedo llamar si tengo preguntas?  Cundo es mi prxima cita con el mdico?  Dnde puedo encontrar un grupo de apoyo para las personas con diabetes? Dnde buscar ms informacin  American Diabetes Association (Asociacin Estadounidense de la Diabetes): www.diabetes.org  American Association of Diabetes Educators (Asociacin Estadounidense de Instructores para el Cuidado de la Diabetes): www.diabeteseducator.org/patient-resources Comunquese con un mdico si:  El nivel de azcar en la sangre es igual o mayor que 240mg/dl (61mmol/dl) d63mnte 2das seguidos.  Ha estado enfermo o ha tenido fiebre durante 2das o ms y no mejora.  Tiene alguno de estos problemas durante ms de 6horas: ? No puede comer ni beber. ? Siente malestar estomacal (nuseas). ? Vomita. ? Presenta heces lquidas (diarrea). Solicite ayuda inmediatamente si:  El nivel de azcar en la sangre est por debajo de 54mg/dl (3mm65m).  Se53mente confundida.  Tiene dificultad para hacer lo siguiente: ? Pensar con claridad. ? La respiracin. Resumen  La diabetes (diabetes mellitus) es una enfermedad de larga duracin (crnica). Se produce cuando el cuerpo no utiliza correctamente Occupational hygieniste se libera de los alimentos despus de la digestin.  Aplquese la insulina y tome los medicamentos para la diabetes como se lo hayan indicado.  Contrlese el nivel de azcar en la sangre todos los das, con la frDow Cityencia que le hayan indicado.  Concurra a todas las visitas de seguimiento como se lo haya indicado el mdico. Esto es importante. Esta informacin no tiene como fin reempMarine scientist  el consejo del mdico. Asegrese de hacerle al mdico cualquier pregunta que tenga. Document Released: 06/05/2017 Document Revised: 03/24/2018 Document Reviewed: 06/05/2017 Elsevier Patient Education  2020 [employer].

## 2018-09-02 LAB — LIPID PANEL
Chol/HDL Ratio: 2.9 ratio (ref 0.0–4.4)
Cholesterol, Total: 230 mg/dL — ABNORMAL HIGH (ref 100–199)
HDL: 79 mg/dL
LDL Calculated: 130 mg/dL — ABNORMAL HIGH (ref 0–99)
Triglycerides: 105 mg/dL (ref 0–149)
VLDL Cholesterol Cal: 21 mg/dL (ref 5–40)

## 2018-09-02 LAB — COMPREHENSIVE METABOLIC PANEL WITH GFR
ALT: 121 IU/L — ABNORMAL HIGH (ref 0–32)
AST: 45 IU/L — ABNORMAL HIGH (ref 0–40)
Albumin/Globulin Ratio: 1.7 (ref 1.2–2.2)
Albumin: 4.5 g/dL (ref 3.8–4.9)
Alkaline Phosphatase: 129 IU/L — ABNORMAL HIGH (ref 39–117)
BUN/Creatinine Ratio: 25 — ABNORMAL HIGH (ref 9–23)
BUN: 18 mg/dL (ref 6–24)
Bilirubin Total: 0.3 mg/dL (ref 0.0–1.2)
CO2: 21 mmol/L (ref 20–29)
Calcium: 9.6 mg/dL (ref 8.7–10.2)
Chloride: 100 mmol/L (ref 96–106)
Creatinine, Ser: 0.73 mg/dL (ref 0.57–1.00)
GFR calc Af Amer: 109 mL/min/1.73
GFR calc non Af Amer: 94 mL/min/1.73
Globulin, Total: 2.6 g/dL (ref 1.5–4.5)
Glucose: 141 mg/dL — ABNORMAL HIGH (ref 65–99)
Potassium: 4.6 mmol/L (ref 3.5–5.2)
Sodium: 139 mmol/L (ref 134–144)
Total Protein: 7.1 g/dL (ref 6.0–8.5)

## 2018-09-04 ENCOUNTER — Other Ambulatory Visit: Payer: Self-pay | Admitting: Family Medicine

## 2018-09-04 DIAGNOSIS — R748 Abnormal levels of other serum enzymes: Secondary | ICD-10-CM

## 2018-09-04 NOTE — Progress Notes (Signed)
Patient ID: Tara Ray, female   DOB: Aug 21, 1965, 53 y.o.   MRN: [id number]    Patient with elevated liver enzymes on recent blood work.  Patient will be informed of the results and that she should hold the use of her Lipitor and return in the next 6 to 8 weeks for repeat hepatic panel/liver enzyme testing.

## 2018-09-22 ENCOUNTER — Ambulatory Visit: Payer: Self-pay | Admitting: Allergy

## 2018-10-13 ENCOUNTER — Ambulatory Visit: Payer: BLUE CROSS/BLUE SHIELD | Admitting: Family Medicine

## 2018-10-28 ENCOUNTER — Ambulatory Visit: Payer: BLUE CROSS/BLUE SHIELD | Admitting: Pharmacist

## 2018-10-28 ENCOUNTER — Other Ambulatory Visit: Payer: Self-pay

## 2018-10-28 ENCOUNTER — Ambulatory Visit: Payer: BLUE CROSS/BLUE SHIELD | Attending: Family Medicine | Admitting: Family Medicine

## 2018-10-28 ENCOUNTER — Encounter: Payer: Self-pay | Admitting: Family Medicine

## 2018-10-28 VITALS — BP 136/83 | HR 62 | Temp 98.2°F | Resp 16 | Wt 235.6 lb

## 2018-10-28 DIAGNOSIS — F4321 Adjustment disorder with depressed mood: Secondary | ICD-10-CM | POA: Diagnosis not present

## 2018-10-28 DIAGNOSIS — Z603 Acculturation difficulty: Secondary | ICD-10-CM

## 2018-10-28 DIAGNOSIS — F4320 Adjustment disorder, unspecified: Secondary | ICD-10-CM

## 2018-10-28 DIAGNOSIS — Z23 Encounter for immunization: Secondary | ICD-10-CM

## 2018-10-28 DIAGNOSIS — R7303 Prediabetes: Secondary | ICD-10-CM

## 2018-10-28 DIAGNOSIS — J3089 Other allergic rhinitis: Secondary | ICD-10-CM

## 2018-10-28 DIAGNOSIS — Z789 Other specified health status: Secondary | ICD-10-CM

## 2018-10-28 DIAGNOSIS — Z758 Other problems related to medical facilities and other health care: Secondary | ICD-10-CM

## 2018-10-28 LAB — GLUCOSE, POCT (MANUAL RESULT ENTRY): POC Glucose: 152 mg/dL — AB (ref 70–99)

## 2018-10-28 MED ORDER — LORATADINE 10 MG PO TABS: 10.0000 mg | ORAL_TABLET | Freq: Every day | ORAL | 3 refills | Status: AC

## 2018-10-28 MED ORDER — TRUE METRIX METER W/DEVICE KIT: PACK | 0 refills | Status: AC

## 2018-10-28 MED ORDER — TRUE METRIX BLOOD GLUCOSE TEST VI STRP: ORAL_STRIP | 12 refills | Status: AC

## 2018-10-28 MED ORDER — METFORMIN HCL 1000 MG PO TABS: 1000.0000 mg | ORAL_TABLET | Freq: Two times a day (BID) | ORAL | 1 refills | Status: DC

## 2018-10-28 MED ORDER — TRUEPLUS LANCETS 28G MISC: 99 refills | Status: AC

## 2018-10-28 MED ORDER — TRAZODONE HCL 50 MG PO TABS: 25.0000 mg | ORAL_TABLET | Freq: Every evening | ORAL | 99 refills | Status: AC | PRN

## 2018-10-28 MED FILL — CONTOUR NEXT METER: W/DEVICE | 1 days supply | Qty: 1 | Fill #0

## 2018-10-28 MED FILL — MICROLET LANCETS MISC: 30 days supply | Qty: 100 | Fill #0

## 2018-10-28 MED FILL — CONTOUR NEXT STRIPS: 30 days supply | Qty: 100 | Fill #0

## 2018-10-28 MED FILL — traZODone HCL 50 MG TABS: 50 | 30 days supply | Qty: 30 | Fill #0

## 2018-10-28 NOTE — Patient Instructions (Addendum)
Recuento de carbohidratos para la diabetes mellitus en los adultos Carbohydrate Counting for Diabetes Mellitus, Adult  El recuento de carbohidratos es un mtodo para llevar un registro de la cantidad de carbohidratos que se ingieren. La ingesta natural de carbohidratos aumenta la cantidad de azcar (glucosa) en la sangre. El recuento de la cantidad de carbohidratos que se ingieren sirve para que el nivel de glucosa en sangre permanezca dentro de los lmites normales, lo que ayuda a mantener la diabetes (diabetes mellitus) bajo control. Es importante saber la cantidad de carbohidratos que se pueden ingerir en cada comida sin correr ningn riesgo. Esto es diferente en cada persona. Un especialista en alimentacin y nutricin (nutricionista certificado) puede ayudarlo a crear un plan de alimentacin y a calcular la cantidad de carbohidratos que debe ingerir en cada comida y colacin. Los siguientes alimentos incluyen carbohidratos:  Granos, como panes y cereales.  Frijoles secos y productos con soja.  Verduras con almidn, como papas, guisantes y maz.  Frutas y jugos de frutas.  Leche y yogur.  Dulces y colaciones, como pasteles, galletas, caramelos, papas fritas de bolsa y refrescos. Cmo se calculan los carbohidratos? Hay dos maneras de calcular los carbohidratos de los alimentos. Puede usar cualquiera de los dos mtodos o una combinacin de ambos. Leer la etiqueta de "informacin nutricional" de los alimentos envasados La lista de "informacin nutricional" est incluida en las etiquetas de casi todas las bebidas y los alimentos envasados de los [place]. Incluye lo siguiente:  El tamao de la porcin.  Informacin sobre los nutrientes de cada porcin, incluidos los gramos (g) de carbohidratos por porcin. Para usar la "informacin nutricional":  Decida cuntas porciones va a comer.  Multiplique la cantidad de porciones por el nmero de carbohidratos por porcin.  El resultado  es la cantidad total de carbohidratos que comer. Conocer los tamaos de las porciones estndar de otros alimentos Cuando coma alimentos que contengan carbohidratos y que no estn envasados o no incluyan la "informacin nutricional" en la etiqueta, debe medir las porciones para poder calcular la cantidad de carbohidratos:  Mida los alimentos que comer con una balanza de alimentos o una taza medidora, si es necesario.  Decida cuntas porciones de tamao estndar comer.  Multiplique el nmero de porciones por15. La mayora de los alimentos con alto contenido de carbohidratos contienen unos 15g de carbohidratos por porcin. ? Por ejemplo, si come 8onzas (170g) de fresas, habr comido 2porciones y 30g de carbohidratos (2porciones x 15g=30g).  En el caso de las comidas que contienen mezclas de ms de un alimento, como las sopas y los guisos, debe calcular los carbohidratos de cada alimento que se incluye. La siguiente lista contiene los tamaos de porciones estndar de los alimentos ricos en carbohidratos ms comunes. Cada una de estas porciones tiene aproximadamente 15g de carbohidratos:  pan de hamburguesa o muffin ingls.  onza (15ml) de jarabe.   onza (14g) de mermelada.  1rebanada de pan.  1tortilla de seis pulgadas.  3onzas (85g) de arroz o pasta cocidos.  4onzas (113g) de frijoles secos cocidos.  4onzas (113g) de verduras con almidn, como guisantes, maz o papas.  4onzas (113g) de cereal caliente.  4 onzas (113g) de pur de papas o de una papa grande al horno.  4onzas (113g) de frutas en lata o congeladas.  4onzas (120ml) de jugo de frutas.  4a 6galletas.  6croquetas de pollo.  6onzas (170g) de cereales secos sin azcar.  6onzas (170g) de yogur descremado sin ningn agregado o de yogur   endulzado con edulcorante artificial.  8onzas (238ml) de [place].  8 onzas (170g) de frutas frescas o una fruta pequea.  24 onzas  (680g) de palomitas de maz. Ejemplo de recuento de carbohidratos Ejemplo de comida  3 onzas (85g) de pechugas de pollo.  6onzas (170g) de arroz integral.  4onzas (113g) de maz.  8onzas (247ml) de leche.  8onzas (170g) de fresas con crema batida sin azcar. Clculo de carbohidratos 1. Identifique los alimentos que contienen carbohidratos: ? Arroz. ? Maz. ? Leche. ? Hughie Closs. 2. Calcule cuntas porciones come de cada alimento: ? 2 porciones de arroz. ? 1 porcin de maz. ? 1 porcin de leche. ? 1 porcin de fresas. 3. Multiplique cada nmero de porciones por 15g: ? 2 porciones de arroz x 15 g = 30 g. ? 1 porcin de maz x 15 g = 15 g. ? 1 porcin de leche x 15 g = 15 g. ? 1 porcin de fresas x 15 g = 15 g. 4. Sume todas las cantidades para conocer el total de gramos de carbohidratos consumidos: ? 30g + 15g + 15g + 15g = 75g de carbohidratos en total. Resumen  El recuento de carbohidratos es un mtodo para llevar un registro de la cantidad de carbohidratos que se ingieren.  La ingesta natural de carbohidratos aumenta la cantidad de azcar (glucosa) en la sangre.  El recuento de la cantidad de carbohidratos que se ingieren sirve para Advertising account executive de glucosa en sangre dentro de los lmites [place], lo que ayuda a Theatre manager la diabetes bajo control.  Un especialista en alimentacin y nutricin (nutricionista certificado) puede ayudarlo a crear un plan de alimentacin y a calcular la cantidad de carbohidratos que debe ingerir en cada comida y colacin. Esta informacin no tiene Marine scientist el consejo del mdico. Asegrese de hacerle al mdico cualquier pregunta que tenga. Document Released: 04/21/2011 Document Revised: 11/18/2016 Document Reviewed: 07/11/2015 Elsevier Patient Education  2020 [place] glucemia, en adultos Blood Glucose Monitoring, Adult Controlar el nivel de azcar en la sangre (glucosa) es una parte importante  del tratamiento de su diabetes (diabetes mellitus). El control de la glucemia implica realizar controles regulares segn le indiquen, y Catering manager un registro de los resultados (registro diario) a lo largo del [place]. Controlar la glucemia en forma peridica y llevar un registro diario puede:  Ayudarlos a usted y al mdico a Programmer, applications de control de la diabetes segn sea necesario, lo que incluye medicamentos o insulina.  Ayudarlo a usted a comprender de [employer], la actividad fsica, las enfermedades y los medicamentos inciden en la glucemia.  Permitirle a usted saber cul es su nivel de glucemia en cualquier momento. Averiguar rpidamente si su nivel de glucemia es bajo (hipoglucemia) o alto (hiperglucemia). El [employer] objetivos personalizados de su tratamiento. Los objetivos estarn basados en su edad, en otras enfermedades que tenga y en cmo responde al tratamiento de la diabetes. Generalmente, el objetivo del tratamiento es [employer] siguientes niveles de glucemia:  Antes de las comidas (preprandial): de 80 a 130mg /dl (4,4 a 7,63mmol/l).  Despus de las comidas (posprandial): por debajo de 180mg /dl (19mmol/l).  Nivel de A1c: menos del 7%. Materiales necesarios:  Medidor de glucemia.  Tiras reactivas para el medidor. Cada medidor tiene sus propias tiras reactivas. Debe usar las tiras reactivas que trajo su medidor.  Una aguja para pincharse el dedo (lanceta). No use una lanceta ms de una vez.  Un dispositivo  que sujeta la lanceta (dispositivo de puncin).  Un diario o cuaderno de anotaciones para Pensions consultant. Cmo controlar su glucemia  1. Lvese las manos con agua y [place]. 2. Pnchese el costado del dedo (no en la punta) con la lanceta. Use un dedo diferente cada vez. 3. Frote suavemente el dedo hasta que aparezca una pequea gota de [place]. 4. Siga las instrucciones que vienen con el medidor para Garment/textile technologist tira Comptroller,  Midwife la sangre sobre la tira y usar el medidor de glucemia. 5. Registre su resultado y las observaciones que desee. Algunos medidores le permiten tomar sangre para la prueba de otras zonas del cuerpo que no son el dedo (zonas alternativas). Las zonas alternativas ms comunes son las siguientes:  Los Field seismologist.  Los muslos.  La palma de la mano. Si cree que puede tener hipoglucemia, o si tiene antecedentes de no saber cundo le est bajando el nivel de glucemia (hipoglucemia asintomtica), no use zonas alternativas. En su lugar, use los dedos. Es posible que las zonas alternativas no sean tan precisas como los dedos porque el flujo de sangre es ms lento en esas zonas. Esto significa que el resultado que obtiene de estas zonas puede estar retrasado y ser un poco diferente del resultado que obtendra de su dedo. Siga estas indicaciones en su casa: Registro diario de glucemia   Cada vez que controle su nivel de glucemia, anote el resultado. Tambin anote aquellos factores que puedan estar afectando su nivel de glucemia, como la dieta y la actividad fsica Designer, multimedia. Esta informacin puede ayudarlos a usted y a su mdico a: ? Charity fundraiser patrones en su glucemia durante el transcurso del [place]. ? Ajustar su plan de control de la diabetes segn sea necesario.  Averige si su Research officer, trade union sus registros en una computadora. La [employer] de los medidores de glucosa guardan un registro de las lecturas realizadas con el medidor. Si usted tiene diabetes tipo 1:  Controle su nivel de glucemia 2 o ms veces al da.  Tambin controle su nivel de glucemia: ? Antes de cada inyeccin de insulina. ? Antes y despus de hacer ejercicio. ? Antes de comer. ? 2 horas despus de una comida. ? Ocasionalmente, entre las 2:00a.m. y las 3:00a.m., como se lo hayan indicado. ? Antes de Optometrist tareas peligrosas, English as a second language teacher o usar maquinaria pesada. ? A la hora de acostarse.  Es posible  que Geophysicist/field seismologist con ms frecuencia sus niveles de glucemia, hasta 6 a 10veces por da, si: ? Usted [place] una bomba de insulina. ? Usted necesita mltiples inyecciones diarias (MDI, por sus siglas en ingls). ? Tiene diabetes que no est bien controlada. ? Usted est enfermo. ? Usted tiene antecedentes de hipoglucemia grave. ? Usted tiene hipoglucemia asintomtica. Si usted tiene diabetes tipo 2:  Si recibe insulina u otros medicamentos para la diabetes, controle su nivel de glucemia 2 o ms veces al Training and development officer.  Si recibe tratamiento intensivo con insulina, debe medirse el nivel de glucemia 4o ms veces al [employer]. Ocasionalmente, es posible que deba controlarlo entre las 2:00a.m. y las 3:00a.m., segn se lo indiquen.  Tambin controle su nivel de glucemia: ? Antes y despus de hacer ejercicio. ? Antes de Optometrist tareas peligrosas, English as a second language teacher o usar maquinaria pesada.  Es posible que deba controlar con ms frecuencia su nivel de glucemia si: ? Necesita ajustar la dosis de sus medicamentos. ? Su diabetes no est bien controlada. ? Est enfermo. Consejos generales  Siempre  tenga sus insumos a mano.  Todos los medidores de glucemia incluyen un nmero de telfono "directo", disponible las 24 horas, al que podr llamar si tiene preguntas o [place]. Tambin puede consultar a su mdico.  Despus de usar algunas cajas de tiras reactivas, ajuste (calibre) el medidor de glucemia segn las instrucciones del medidor. Comunquese con un mdico si:  El nivel de glucemia es mayor o igual que 240mg /dl (13,76mmol/dl) durante 2das seguidos.  Ha estado enfermo o ha tenido fiebre durante ms de 2das y no mejora.  Tiene alguno de los siguientes problemas durante ms de 6horas: ? No puede comer ni beber. ? Tiene nuseas o vmitos. ? Tiene diarrea. Solicite ayuda de inmediato si:  Su nivel de glucemia est por debajo de 54mg /dl (37mmol/l).  Se siente confundido o tiene dificultad para  pensar con claridad.  Tiene dificultad para respirar.  Tiene un nivel moderado o alto de cetonas en la [place]. Resumen  Controlar el nivel de azcar en la sangre (glucosa) es una parte importante del tratamiento de su diabetes (diabetes mellitus).  El control de la glucemia implica realizar controles regulares segn le indiquen, y Catering manager un registro de los resultados (registro diario) a lo largo del [place].  El [employer] objetivos personalizados de su tratamiento. Los objetivos estarn basados en su edad, en otras enfermedades que tenga y en cmo responde al tratamiento de la diabetes.  Cada vez que controle su nivel de glucemia, anote el resultado. Tambin anote aquellos factores que puedan estar afectando su nivel de glucemia, como la dieta y la actividad fsica Designer, multimedia. Esta informacin no tiene Marine scientist el consejo del mdico. Asegrese de hacerle al mdico cualquier pregunta que tenga. Document Released: 01/27/2005 Document Revised: 05/08/2017 Document Reviewed: 07/09/2015 Elsevier Patient Education  2020 [employer].

## 2018-10-28 NOTE — Progress Notes (Signed)
Established Patient Office Visit  Subjective:  Patient ID: Tara Ray, female    DOB: 08-Feb-1966  Age: 53 y.o. MRN: [id number]  CC:  Chief Complaint  Patient presents with  . Follow-up    HPI Tara Ray presents for follow-up after new patient visit on 09/01/2018.  Patient reports that she has started the use of metformin without difficulty.  She does not have anything at home to monitor her blood sugars.  She reports that she is drinking water and no longer drinks juices or sodas.  She did eat about an hour before coming into the office.  She reports that she got off of work and went home before coming to this appointment and had some coffee and bread.  She states that she had stopped eating bread/decreased bread intake after learning that she was diabetic but needed to grab something quick today.  She denies any increased thirst or urinary frequency.  No numbness or tingling in the hands or feet.  She does have some fatigue.  At her last visit, patient expressed that her pregnant daughter and unborn grandchild had unexpectedly passed away.  Patient was prescribed trazodone and she reports that this did help with sleep but she would like to have a refill if possible.  She reports that she was unable to see the allergist that she was referred to as they did not take her insurance but she was able to get an appointment at a different office.  She has not yet seen the allergist but she reports that with the use of Claritin, her nasal congestion and sensation of throat irritation have improved.  She works at [employer] that has a lot of metal shavings in the air which have caused her in the past to have nasal and throat irritation but she states that the Claritin has improved the sensation.  Past Medical History:  Diagnosis Date  . Anemia   . Arthritis    knees  . Cancer [facility])    breast cancer left 2016    Past Surgical History:  Procedure Laterality Date  . BREAST LUMPECTOMY  Left   . CHOLECYSTECTOMY    . KNEE SURGERY      Family History  Problem Relation Age of Onset  . Hypertension Mother   . Stomach cancer Maternal Aunt   . Stomach cancer Maternal Aunt   . Colon cancer Neg Hx   . Esophageal cancer Neg Hx   . Rectal cancer Neg Hx     Social History   Socioeconomic History  . Marital status: Married    Spouse name: Not on file  . Number of children: Not on file  . Years of education: Not on file  . Highest education level: Not on file  Occupational History  . Not on file  Social Needs  . Financial resource strain: Not on file  . Food insecurity    Worry: Not on file    Inability: Not on file  . Transportation needs    Medical: Not on file    Non-medical: Not on file  Tobacco Use  . Smoking status: Former Research scientist (life sciences)  . Smokeless tobacco: Never Used  . Tobacco comment: quit over 18 years ago  Substance and Sexual Activity  . Alcohol use: No    Comment: for greater than 7 years  . Drug use: Never  . Sexual activity: Yes  Lifestyle  . Physical activity    Days per week: Not on file  Minutes per session: Not on file  . Stress: Not on file  Relationships  . Social Herbalist on phone: Not on file    Gets together: Not on file    Attends religious service: Not on file    Active member of club or organization: Not on file    Attends meetings of clubs or organizations: Not on file    Relationship status: Not on file  . Intimate partner violence    Fear of current or ex partner: Not on file    Emotionally abused: Not on file    Physically abused: Not on file    Forced sexual activity: Not on file  Other Topics Concern  . Not on file  Social History Narrative  . Not on file    Outpatient Medications Prior to Visit  Medication Sig Dispense Refill  . atorvastatin (LIPITOR) 10 MG tablet Take 1 tablet (10 mg total) by mouth daily. To lower cholesterol 90 tablet 1  . EPINEPHrine (EPIPEN 2-PAK) 0.3 mg/0.3 mL IJ SOAJ injection  Inject 0.3 mLs (0.3 mg total) into the muscle as needed for anaphylaxis. 1 each prn  . HYDROcodone-acetaminophen (NORCO) 5-325 MG tablet Take 1 tablet by mouth every 6 (six) hours as needed for moderate pain. (Patient not taking: Reported on 09/01/2018) 20 tablet 0  . ibuprofen (ADVIL) 600 MG tablet Take 1 tablet (600 mg total) by mouth every 8 (eight) hours as needed for moderate pain. 60 tablet 3  . loratadine (CLARITIN) 10 MG tablet Take 1 tablet (10 mg total) by mouth daily. 90 tablet 3  . metFORMIN (GLUCOPHAGE) 1000 MG tablet Take 1 tablet (1,000 mg total) by mouth 2 (two) times daily. 180 tablet 1  . methocarbamol (ROBAXIN) 500 MG tablet Take 1 tablet (500 mg total) by mouth 2 (two) times daily as needed for muscle spasms. (Patient not taking: Reported on 09/01/2018) 12 tablet 0  . naproxen (NAPROSYN) 500 MG tablet Take 1 tablet (500 mg total) by mouth 2 (two) times daily. (Patient not taking: Reported on 01/11/2018) 30 tablet 0  . oxyCODONE-acetaminophen (PERCOCET/ROXICET) 5-325 MG tablet Take 1-2 tablets by mouth every 6 (six) hours as needed for severe pain. (Patient not taking: Reported on 12/28/2017) 15 tablet 0  . predniSONE (STERAPRED UNI-PAK 21 TAB) 10 MG (21) TBPK tablet Take 6 tabs (10m) day 1, 5 tabs (526m day 2, 4 tabs (4074mday 3, 3 tabs (66m79may 4, 2 tabs (20mg27my 5, and 1 tab (10mg)41m 6. (Patient not taking: Reported on 10/28/2018) 21 tablet 0  . traZODone (DESYREL) 50 MG tablet Take 0.5-1 tablets (25-50 mg total) by mouth at bedtime as needed for sleep. 30 tablet 3   No facility-administered medications prior to visit.     Allergies  Allergen Reactions  . Aspirin Shortness Of Breath    Has problems swallowing, itching  . Shellfish Allergy Rash    ROS Review of Systems  Constitutional: Positive for fatigue. Negative for chills and fever.  HENT: Positive for congestion. Negative for postnasal drip, sneezing, sore throat and trouble swallowing.   Eyes: Negative for  photophobia and visual disturbance.  Respiratory: Negative for cough and shortness of breath.   Cardiovascular: Negative for chest pain, palpitations and leg swelling.  Gastrointestinal: Negative for abdominal pain, constipation, diarrhea and nausea.  Endocrine: Negative for polydipsia, polyphagia and polyuria.  Genitourinary: Negative for dysuria and frequency.  Musculoskeletal: Negative for arthralgias, back pain and gait problem.  Neurological: Negative for  dizziness and headaches.  Hematological: Negative for adenopathy. Does not bruise/bleed easily.  Psychiatric/Behavioral: Positive for sleep disturbance. Negative for self-injury and suicidal ideas. The patient is nervous/anxious.       Objective:    Physical Exam  Constitutional: She is oriented to person, place, and time. She appears well-developed and well-nourished.  Neck: Normal range of motion. Neck supple.  Cardiovascular: Normal rate and regular rhythm.  Pulmonary/Chest: Effort normal and breath sounds normal.  Abdominal: Soft. There is no abdominal tenderness. There is no rebound and no guarding.  Musculoskeletal:        General: No tenderness or edema.     Comments: No CVA tenderness  Lymphadenopathy:    She has no cervical adenopathy.  Neurological: She is alert and oriented to person, place, and time.  Skin: Skin is warm and dry.  Psychiatric: She has a normal mood and affect. Her behavior is normal.  Nursing note and vitals reviewed.   BP 136/83   Pulse 62   Temp 98.2 F (36.8 C) (Oral)   Resp 16   Wt 235 lb 9.6 oz (106.9 kg)   LMP 07/28/2017   SpO2 98%   BMI 38.03 kg/m  Wt Readings from Last 3 Encounters:  10/28/18 235 lb 9.6 oz (106.9 kg)  09/01/18 230 lb 3.2 oz (104.4 kg)  08/22/18 230 lb (104.3 kg)     Health Maintenance Due  Topic Date Due  . HIV Screening  10/09/1979  . TETANUS/TDAP  10/09/1983  . PAP SMEAR-Modifier  10/08/1985  . MAMMOGRAM  10/09/2014  . INFLUENZA VACCINE  09/11/2018    Patient had influenza immunization given at today's visit   No results found for: TSH Lab Results  Component Value Date   WBC 7.0 04/07/2015   HGB 11.2 (L) 04/07/2015   HCT 33.0 (L) 04/07/2015   MCV 80.4 04/07/2015   PLT 257 04/07/2015   Lab Results  Component Value Date   NA 139 09/01/2018   K 4.6 09/01/2018   CO2 21 09/01/2018   GLUCOSE 141 (H) 09/01/2018   BUN 18 09/01/2018   CREATININE 0.73 09/01/2018   BILITOT 0.3 09/01/2018   ALKPHOS 129 (H) 09/01/2018   AST 45 (H) 09/01/2018   ALT 121 (H) 09/01/2018   PROT 7.1 09/01/2018   ALBUMIN 4.5 09/01/2018   CALCIUM 9.6 09/01/2018   ANIONGAP 7 04/07/2015   Lab Results  Component Value Date   CHOL 230 (H) 09/01/2018   Lab Results  Component Value Date   HDL 79 09/01/2018   Lab Results  Component Value Date   LDLCALC 130 (H) 09/01/2018   Lab Results  Component Value Date   TRIG 105 09/01/2018   Lab Results  Component Value Date   CHOLHDL 2.9 09/01/2018   Lab Results  Component Value Date   HGBA1C 6.8 (A) 09/01/2018      Assessment & Plan:  1. type 2 diabetes Through the interpreter, discussed with the patient that her last hemoglobin A1c was 6.8 which is diagnostic of diabetes.  Patient will continue the use of Glucophage.  Patient with postprandial glucose of 151 at today's visit after eating bread and coffee with sugar/cream.  Handouts given in Spanish on monitoring blood sugar as well as carbohydrate counting.  Patient provided with prescriptions to obtain glucometer, lancets and strips so that she can begin monitoring her blood sugars at home.  Discussed fasting goal blood sugars between 90-120 as well as goal of postprandial blood sugars being 1 40-1 60  within 2 hours of a meal.  Patient was offered and agreed to have influenza immunization at today's visit.  At her next visit, she will need lipid panel, diabetic foot exam, urine microalbumin creatinine and referral for diabetic eye exam.  Patient should  call or return sooner if blood sugars are elevated or if she has any issues or concerns with her blood sugars or medications - POCT glucose (manual entry) - metFORMIN (GLUCOPHAGE) 1000 MG tablet; Take 1 tablet (1,000 mg total) by mouth 2 (two) times daily.  Dispense: 180 tablet; Refill: 1 - Blood Glucose Monitoring Suppl (TRUE METRIX METER) w/Device KIT; Use  to monitor blood sugar  Dispense: 1 kit; Refill: 0 - glucose blood (TRUE METRIX BLOOD GLUCOSE TEST) test strip; Use as instructed to check blood sugar once daily and as needed  Dispense: 100 each; Refill: 12 - TRUEplus Lancets 28G MISC; Use when checking blood sugar  Dispense: 100 each; Refill: prn  2. Allergic rhinitis due to other allergic trigger, unspecified seasonality She reports improvement in nasal congestion and postnasal drainage/throat irritation with the use of Claritin and refills were provided - loratadine (CLARITIN) 10 MG tablet; Take 1 tablet (10 mg total) by mouth daily.  Dispense: 90 tablet; Refill: 3  3. Grief reaction Refill provided of trazodone for patient to take to help with sleep and mood as patient is status post the death of her daughter and unborn grandchild. - traZODone (DESYREL) 50 MG tablet; Take 0.5-1 tablets (25-50 mg total) by mouth at bedtime as needed for sleep.  Dispense: 30 tablet; Refill: prn  4.  Language barrier Stratus video communication system used at today's visit to help with language barrier/translation of health information  An After Visit Summary was printed and given to the patient.  Follow-up: An After Visit Summary was printed and given to the patient.   Antony Blackbird, MD

## 2018-10-28 NOTE — Progress Notes (Signed)
Patient presents for vaccination against influenza per orders of Dr. Fulp. Consent given. Counseling provided. No contraindications exists. Vaccine administered without incident.   

## 2019-11-18 ENCOUNTER — Other Ambulatory Visit: Payer: Self-pay | Admitting: Family Medicine

## 2019-11-18 DIAGNOSIS — R7303 Prediabetes: Secondary | ICD-10-CM

## 2019-12-04 ENCOUNTER — Other Ambulatory Visit: Payer: Self-pay | Admitting: Family Medicine

## 2019-12-04 DIAGNOSIS — R7303 Prediabetes: Secondary | ICD-10-CM

## 2019-12-04 NOTE — Telephone Encounter (Signed)
Requested medication (s) are due for refill today: yes  Requested medication (s) are on the active medication list: yes  Last refill:  11/18/19 #30 (courtesy RF)  Future visit scheduled: no  Notes to clinic: pt needs appt- called pt using [facility] # [phone]- LM on VM to call office to schedule appt.   Requested Prescriptions  Pending Prescriptions Disp Refills   metFORMIN (GLUCOPHAGE) 1000 MG tablet [Pharmacy Med Name: METFORMIN 1000MG TABLETS] 30 tablet 0    Sig: TAKE 1 TABLET(1000 MG) BY MOUTH TWICE DAILY      Endocrinology:  Diabetes - Biguanides Failed - 12/04/2019  3:47 AM      Failed - Cr in normal range and within 360 days    Creatinine, Ser  Date Value Ref Range Status  09/01/2018 0.73 0.57 - 1.00 mg/dL Final          Failed - HBA1C is between 0 and 7.9 and within 180 days    Hemoglobin A1C  Date Value Ref Range Status  09/01/2018 6.8 (A) 4.0 - 5.6 % Final          Failed - eGFR in normal range and within 360 days    GFR calc Af Amer  Date Value Ref Range Status  09/01/2018 109 >59 mL/min/1.73 Final   GFR calc non Af Amer  Date Value Ref Range Status  09/01/2018 94 >59 mL/min/1.73 Final          Failed - Valid encounter within last 6 months    Recent Outpatient Visits           1 year ago type 2 diabetes   [place] F[facility], [place], MD   1 year ago Shellfish allergy   [place], [place], MD

## 2020-08-30 ENCOUNTER — Emergency Department (HOSPITAL_COMMUNITY)
Admission: EM | Admit: 2020-08-30 | Discharge: 2020-08-31 | Disposition: A | Discharge status: Home / Self Care | Payer: BLUE CROSS/BLUE SHIELD | Attending: Emergency Medicine | Admitting: Emergency Medicine

## 2020-08-30 ENCOUNTER — Other Ambulatory Visit: Payer: Self-pay

## 2020-08-30 DIAGNOSIS — Z853 Personal history of malignant neoplasm of breast: Secondary | ICD-10-CM | POA: Insufficient documentation

## 2020-08-30 DIAGNOSIS — W228XXA Striking against or struck by other objects, initial encounter: Secondary | ICD-10-CM | POA: Insufficient documentation

## 2020-08-30 DIAGNOSIS — S0501XA Injury of conjunctiva and corneal abrasion without foreign body, right eye, initial encounter: Secondary | ICD-10-CM | POA: Insufficient documentation

## 2020-08-30 DIAGNOSIS — Z23 Encounter for immunization: Secondary | ICD-10-CM | POA: Diagnosis not present

## 2020-08-30 DIAGNOSIS — Y93E5 Activity, floor mopping and cleaning: Secondary | ICD-10-CM | POA: Diagnosis not present

## 2020-08-30 DIAGNOSIS — Z87891 Personal history of nicotine dependence: Secondary | ICD-10-CM | POA: Insufficient documentation

## 2020-08-30 DIAGNOSIS — S0591XA Unspecified injury of right eye and orbit, initial encounter: Secondary | ICD-10-CM | POA: Diagnosis present

## 2020-08-30 NOTE — ED Triage Notes (Addendum)
Patient c/o pain to right eye after hitting it with mop stick while mopping. Reports blurred vision in right eye.

## 2020-08-31 ENCOUNTER — Encounter (HOSPITAL_COMMUNITY): Payer: Self-pay | Admitting: Emergency Medicine

## 2020-08-31 MED ORDER — TETRACAINE HCL 0.5 % OP SOLN
2.0000 [drp] | Freq: Once | OPHTHALMIC | Status: AC
  Administered 2020-08-31: 2 [drp] via OPHTHALMIC
  Filled 2020-08-31: qty 4

## 2020-08-31 MED ORDER — ACETAMINOPHEN 500 MG PO TABS
1000.0000 mg | ORAL_TABLET | Freq: Once | ORAL | Status: AC
  Administered 2020-08-31: 1000 mg via ORAL
  Filled 2020-08-31: qty 2

## 2020-08-31 MED ORDER — FLUORESCEIN SODIUM 1 MG OP STRP
1.0000 | ORAL_STRIP | Freq: Once | OPHTHALMIC | Status: AC
  Administered 2020-08-31: 1 via OPHTHALMIC
  Filled 2020-08-31: qty 1

## 2020-08-31 MED ORDER — CIPROFLOXACIN HCL 0.3 % OP SOLN: 2.0000 [drp] | OPHTHALMIC | 0 refills | Status: AC

## 2020-08-31 MED ORDER — TETANUS-DIPHTH-ACELL PERTUSSIS 5-2.5-18.5 LF-MCG/0.5 IM SUSY
0.5000 mL | PREFILLED_SYRINGE | Freq: Once | INTRAMUSCULAR | Status: AC
  Administered 2020-08-31: 0.5 mL via INTRAMUSCULAR
  Filled 2020-08-31: qty 0.5

## 2020-08-31 NOTE — ED Provider Notes (Signed)
[facility] DEPT Provider Note   CSN: [id number] Arrival date & time: 08/30/20  2225     History Chief Complaint  Patient presents with  . Eye Injury    Tara Ray is a 55 y.o. female.  The history is provided by the patient.  Eye Injury This is a new problem. The current episode started 6 to 12 hours ago. The problem occurs constantly. The problem has not changed since onset.Pertinent negatives include no chest pain, no abdominal pain, no headaches and no shortness of breath. Nothing aggravates the symptoms. Nothing relieves the symptoms. She has tried nothing for the symptoms. The treatment provided no relief.  Patient was hit in the right eye with a end of a mop while mopping.      Past Medical History:  Diagnosis Date  . Anemia   . Arthritis    knees  . Cancer [facility])    breast cancer left 2016    There are no problems to display for this patient.   Past Surgical History:  Procedure Laterality Date  . BREAST LUMPECTOMY Left   . CHOLECYSTECTOMY    . KNEE SURGERY       OB History   No obstetric history on file.     Family History  Problem Relation Age of Onset  . Hypertension Mother   . Stomach cancer Maternal Aunt   . Stomach cancer Maternal Aunt   . Colon cancer Neg Hx   . Esophageal cancer Neg Hx   . Rectal cancer Neg Hx     Social History   Tobacco Use  . Smoking status: Former  . Smokeless tobacco: Never  . Tobacco comments:    quit over 18 years ago  Vaping Use  . Vaping Use: Never used  Substance Use Topics  . Alcohol use: No    Comment: for greater than 7 years  . Drug use: Never    Home Medications Prior to Admission medications   Medication Sig Start Date End Date Taking? Authorizing Provider  atorvastatin (LIPITOR) 10 MG tablet Take 1 tablet (10 mg total) by mouth daily. To lower cholesterol 09/01/18   Fulp, Cammie, MD  Blood Glucose Monitoring Suppl (TRUE METRIX METER) w/Device KIT Use  to  monitor blood sugar 10/28/18   Fulp, Cammie, MD  EPINEPHrine (EPIPEN 2-PAK) 0.3 mg/0.3 mL IJ SOAJ injection Inject 0.3 mLs (0.3 mg total) into the muscle as needed for anaphylaxis. 09/01/18   Fulp, Cammie, MD  glucose blood (TRUE METRIX BLOOD GLUCOSE TEST) test strip Use as instructed to check blood sugar once daily and as needed 10/28/18   Fulp, Cammie, MD  HYDROcodone-acetaminophen (NORCO) 5-325 MG tablet Take 1 tablet by mouth every 6 (six) hours as needed for moderate pain. Patient not taking: Reported on 09/01/2018 04/07/15   Jeannett Senior, PA-C  ibuprofen (ADVIL) 600 MG tablet Take 1 tablet (600 mg total) by mouth every 8 (eight) hours as needed for moderate pain. 09/01/18   Fulp, Cammie, MD  loratadine (CLARITIN) 10 MG tablet Take 1 tablet (10 mg total) by mouth daily. 10/28/18   Fulp, Cammie, MD  metFORMIN (GLUCOPHAGE) 1000 MG tablet TAKE 1 TABLET(1000 MG) BY MOUTH TWICE DAILY 11/18/19   Fulp, Cammie, MD  methocarbamol (ROBAXIN) 500 MG tablet Take 1 tablet (500 mg total) by mouth 2 (two) times daily as needed for muscle spasms. Patient not taking: Reported on 09/01/2018 01/02/16   Antonietta Breach, PA-C  naproxen (NAPROSYN) 500 MG tablet Take 1 tablet (  500 mg total) by mouth 2 (two) times daily. Patient not taking: Reported on 01/11/2018 01/02/16   Antonietta Breach, PA-C  oxyCODONE-acetaminophen (PERCOCET/ROXICET) 5-325 MG tablet Take 1-2 tablets by mouth every 6 (six) hours as needed for severe pain. Patient not taking: Reported on 12/28/2017 01/02/16   Antonietta Breach, PA-C  predniSONE (STERAPRED UNI-PAK 21 TAB) 10 MG (21) TBPK tablet Take 6 tabs ($Remov'60mg'ppHuIY$ ) day 1, 5 tabs ($Remov'50mg'LhfDrD$ ) day 2, 4 tabs ($Remov'40mg'GslPVZ$ ) day 3, 3 tabs ($Remov'30mg'wSBxVk$ ) day 4, 2 tabs ($Remov'20mg'HDucIQ$ ) day 5, and 1 tab ($Remo'10mg'ZDcct$ ) day 6. Patient not taking: Reported on 10/28/2018 08/22/18   Lorayne Bender, PA-C  traZODone (DESYREL) 50 MG tablet Take 0.5-1 tablets (25-50 mg total) by mouth at bedtime as needed for sleep. 10/28/18   Fulp, Ander Gaster, MD  TRUEplus Lancets 28G MISC Use  when checking blood sugar 10/28/18   Fulp, Cammie, MD    Allergies    Aspirin and Shellfish allergy  Review of Systems   Review of Systems  Constitutional:  Negative for fever.  HENT:  Negative for facial swelling.   Eyes:  Positive for pain.  Respiratory:  Negative for shortness of breath.   Cardiovascular:  Negative for chest pain.  Gastrointestinal:  Negative for abdominal pain.  Genitourinary:  Negative for difficulty urinating.  Musculoskeletal:  Negative for neck stiffness.  Skin:  Negative for rash.  Neurological:  Negative for headaches.  Psychiatric/Behavioral:  Negative for agitation.   All other systems reviewed and are negative.  Physical Exam Updated Vital Signs BP (!) 166/92 (BP Location: Left Arm)   Pulse 70   Temp 98.3 F (36.8 C) (Oral)   Resp 16   Ht $R'5\' 5"'aF$  (1.651 m)   Wt 102.1 kg   LMP 07/28/2017   SpO2 95%   BMI 37.44 kg/m   Physical Exam Vitals and nursing note reviewed. Exam conducted with a chaperone present.  Constitutional:      General: She is not in acute distress.    Appearance: Normal appearance.  HENT:     Head: Normocephalic and atraumatic.     Nose: Nose normal.  Eyes:     Extraocular Movements: Extraocular movements intact.     Pupils: Pupils are equal, round, and reactive to light.   Cardiovascular:     Rate and Rhythm: Normal rate and regular rhythm.     Pulses: Normal pulses.     Heart sounds: Normal heart sounds.  Pulmonary:     Effort: Pulmonary effort is normal.     Breath sounds: Normal breath sounds.  Abdominal:     General: Abdomen is flat. Bowel sounds are normal.     Palpations: Abdomen is soft.     Tenderness: There is no abdominal tenderness. There is no guarding.  Musculoskeletal:        General: Normal range of motion.     Cervical back: Normal range of motion and neck supple.  Skin:    General: Skin is warm and dry.     Capillary Refill: Capillary refill takes less than 2 seconds.  Neurological:      General: No focal deficit present.     Mental Status: She is alert and oriented to person, place, and time.     Deep Tendon Reflexes: Reflexes normal.  Psychiatric:        Mood and Affect: Mood normal.        Behavior: Behavior normal.    ED Results / Procedures / Treatments   Labs (all labs ordered  are listed, but only abnormal results are displayed) Labs Reviewed - No data to display  EKG None  Radiology No results found.  Procedures Procedures   Medications Ordered in ED Medications  Tdap (BOOSTRIX) injection 0.5 mL (has no administration in time range)  acetaminophen (TYLENOL) tablet 1,000 mg (has no administration in time range)  fluorescein ophthalmic strip 1 strip (1 strip Both Eyes Given 08/31/20 0105)  tetracaine (PONTOCAINE) 0.5 % ophthalmic solution 2 drop (2 drops Right Eye Given 08/31/20 0103)     ED Course  I have reviewed the triage vital signs and the nursing notes.  Pertinent labs & imaging results that were available during my care of the patient were reviewed by me and considered in my medical decision making (see chart for details).   Corneal abrasion.  Will prescribe drops and close follow up with ophthalmology as an outpatient.  Strict return precautions given.    Tara Ray was evaluated in Emergency Department on 08/31/2020 for the symptoms described in the history of present illness. She was evaluated in the context of the global COVID-19 pandemic, which necessitated consideration that the patient might be at risk for infection with the SARS-CoV-2 virus that causes COVID-19. Institutional protocols and algorithms that pertain to the evaluation of patients at risk for COVID-19 are in a state of rapid change based on information released by regulatory bodies including the CDC and federal and state organizations. These policies and algorithms were followed during the patient's care in the ED.    Final Clinical Impression(s) / ED Diagnoses Final  diagnoses:  None   Return for intractable cough, coughing up blood, fevers > 100.4 unrelieved by medication, shortness of breath, intractable vomiting, chest pain, shortness of breath, weakness, numbness, changes in speech, facial asymmetry, abdominal pain, passing out, Inability to tolerate liquids or food, cough, altered mental status or any concerns. No signs of systemic illness or infection. The patient is nontoxic-appearing on exam and vital signs are within normal limits. I have reviewed the triage vital signs and the nursing notes. Pertinent labs & imaging results that were available during my care of the patient were reviewed by me and considered in my medical decision making (see chart for details). After history, exam, and medical workup I feel the patient has been appropriately medically screened and is safe for discharge home. Pertinent diagnoses were discussed with the patient. Patient was given return precautions.  Rx / DC Orders ED Discharge Orders     None        Eithel Ryall, MD 08/31/20 0111

## 2023-06-04 ENCOUNTER — Emergency Department (HOSPITAL_COMMUNITY)
Admission: EM | Admit: 2023-06-04 | Discharge: 2023-06-04 | Disposition: A | Discharge status: Home / Self Care | Attending: Emergency Medicine | Admitting: Emergency Medicine

## 2023-06-04 ENCOUNTER — Other Ambulatory Visit: Payer: Self-pay

## 2023-06-04 DIAGNOSIS — T7840XA Allergy, unspecified, initial encounter: Secondary | ICD-10-CM | POA: Insufficient documentation

## 2023-06-04 MED ORDER — DEXAMETHASONE SODIUM PHOSPHATE 10 MG/ML IJ SOLN
10.0000 mg | Freq: Once | INTRAMUSCULAR | Status: AC
  Administered 2023-06-04: 10 mg via INTRAMUSCULAR
  Filled 2023-06-04: qty 1

## 2023-06-04 MED ORDER — PREDNISONE 10 MG PO TABS
ORAL_TABLET | ORAL | 0 refills | Status: AC
  Filled 2023-06-04: qty 30, 10d supply, fill #0

## 2023-06-04 MED ORDER — DIPHENHYDRAMINE HCL 25 MG PO CAPS
25.0000 mg | ORAL_CAPSULE | Freq: Once | ORAL | Status: AC
  Administered 2023-06-04: 25 mg via ORAL
  Filled 2023-06-04: qty 1

## 2023-06-04 MED ORDER — DIPHENHYDRAMINE HCL 25 MG PO CAPS
25.0000 mg | ORAL_CAPSULE | Freq: Four times a day (QID) | ORAL | 0 refills | Status: AC | PRN
  Filled 2023-06-04: qty 20, 5d supply, fill #0

## 2023-06-04 NOTE — ED Provider Notes (Signed)
 [facility] EMERGENCY DEPARTMENT AT [facility] Provider Note   CSN: [id number] Arrival date & time: 06/04/23  1117     History  Chief Complaint  Patient presents with   Allergic Reaction    Tara Ray is a 58 y.o. female.  HPI    Patient is Spanish-speaking only.  I did give her the option of using translation service, however patient requested that her daughter translate for her.  The daughter was comfortable translating, patient preferred daughter over translator service.  58 year old female comes in with chief complaint of allergic reaction.  Patient is allergic to aspirin, she has no seasonal allergies or any skin disease/hypersensitivities.  She indicates that about 2 days ago she started having itching and rash all over her body.  She has taken loratadine  without any relief.  Patient denies any kidney problems, liver disease and denies any heavy smoking, substance use, heavy alcohol use.  She denies any new exposures, camping or products.  Patient denies any nausea, vomiting, abdominal pain, difficulty in breathing or oral discomfort.  Home Medications Prior to Admission medications   Medication Sig Start Date End Date Taking? Authorizing Provider  diphenhydrAMINE  (BENADRYL ) 25 mg capsule Take 1 capsule (25 mg total) by mouth every 6 (six) hours as needed for itching. 06/04/23  Yes Deatra Face, MD  predniSONE  (DELTASONE ) 10 MG tablet Take by mouth daily. Take 5 tabs by mouth daily for 2 days, then 4 tabs for 2 days, then 3 tabs for 2 days, then 2 tabs for 2 days, 1 tab for 2 days 06/04/23  Yes Deatra Face, MD  atorvastatin  (LIPITOR) 10 MG tablet Take 1 tablet (10 mg total) by mouth daily. To lower cholesterol 09/01/18   Fulp, Cammie, MD  Blood Glucose Monitoring Suppl (TRUE METRIX METER) w/Device KIT Use  to monitor blood sugar 10/28/18   Fulp, Cammie, MD  ciprofloxacin  (CILOXAN ) 0.3 % ophthalmic solution Place 2 drops into both eyes every 4 (four)  hours while awake. Administer 1 drop, every 2 hours, while awake, for 2 days. Then 1 drop, every 4 hours, while awake, for the next 5 days. 08/31/20   Palumbo, April, MD  EPINEPHrine  (EPIPEN  2-PAK) 0.3 mg/0.3 mL IJ SOAJ injection Inject 0.3 mLs (0.3 mg total) into the muscle as needed for anaphylaxis. 09/01/18   Fulp, Cammie, MD  glucose blood (TRUE METRIX BLOOD GLUCOSE TEST) test strip Use as instructed to check blood sugar once daily and as needed 10/28/18   Fulp, Cammie, MD  HYDROcodone -acetaminophen  (NORCO) 5-325 MG tablet Take 1 tablet by mouth every 6 (six) hours as needed for moderate pain. Patient not taking: Reported on 09/01/2018 04/07/15   Kirichenko, Tatyana, PA-C  ibuprofen  (ADVIL ) 600 MG tablet Take 1 tablet (600 mg total) by mouth every 8 (eight) hours as needed for moderate pain. 09/01/18   Fulp, Cammie, MD  loratadine  (CLARITIN ) 10 MG tablet Take 1 tablet (10 mg total) by mouth daily. 10/28/18   Fulp, Cammie, MD  metFORMIN  (GLUCOPHAGE ) 1000 MG tablet TAKE 1 TABLET(1000 MG) BY MOUTH TWICE DAILY 11/18/19   Fulp, Cammie, MD  methocarbamol  (ROBAXIN ) 500 MG tablet Take 1 tablet (500 mg total) by mouth 2 (two) times daily as needed for muscle spasms. Patient not taking: Reported on 09/01/2018 01/02/16   Carleton Cheek, PA-C  naproxen  (NAPROSYN ) 500 MG tablet Take 1 tablet (500 mg total) by mouth 2 (two) times daily. Patient not taking: Reported on 01/11/2018 01/02/16   Carleton Cheek, PA-C  oxyCODONE -acetaminophen  (PERCOCET/ROXICET) [phone]  MG tablet Take 1-2 tablets by mouth every 6 (six) hours as needed for severe pain. Patient not taking: Reported on 12/28/2017 01/02/16   Carleton Cheek, PA-C  traZODone  (DESYREL ) 50 MG tablet Take 0.5-1 tablets (25-50 mg total) by mouth at bedtime as needed for sleep. 10/28/18   Fulp, Margy Shin, MD  TRUEplus Lancets 28G MISC Use when checking blood sugar 10/28/18   Fulp, Cammie, MD      Allergies    Aspirin and Shellfish allergy    Review of Systems   Review of Systems   All other systems reviewed and are negative.   Physical Exam Updated Vital Signs BP (!) 154/96 (BP Location: Left Arm)   Pulse 81   Temp (!) 97.5 F (36.4 C) (Oral)   Resp 15   Ht 5\' 5"  (1.651 m)   Wt 97.5 kg   LMP 07/28/2017   SpO2 98%   BMI 35.78 kg/m  Physical Exam Vitals and nursing note reviewed.  Constitutional:      Appearance: She is well-developed.  HENT:     Head: Normocephalic and atraumatic.  Eyes:     Extraocular Movements: Extraocular movements intact.  Cardiovascular:     Rate and Rhythm: Normal rate.  Pulmonary:     Effort: Pulmonary effort is normal.  Musculoskeletal:     Cervical back: Normal range of motion and neck supple.  Skin:    General: Skin is dry.     Findings: Erythema present.     Comments: Fine macular/petechial type rash noted over bilateral lower extremity, most pronounced below the knee, and in the upper extremities.  Rash also present over the back.  Face appears to be spared.  No oral mucosal involvement.  Sclera is clear.  Neurological:     Mental Status: She is alert and oriented to person, place, and time.              ED Results / Procedures / Treatments   Labs (all labs ordered are listed, but only abnormal results are displayed) Labs Reviewed - No data to display  EKG None  Radiology No results found.  Procedures Procedures    Medications Ordered in ED Medications  dexamethasone  (DECADRON ) injection 10 mg (has no administration in time range)  diphenhydrAMINE  (BENADRYL ) capsule 25 mg (has no administration in time range)    ED Course/ Medical Decision Making/ A&P                                 Medical Decision Making Risk Prescription drug management.  58 year old female comes in with chief complaint of allergic reaction.  Differential considered for her includes anaphylaxis, hyperbilirubinemia, allergic reaction, hypersensitivity.  Clinically, patient is stable.  Clinically not  anaphylaxis. Oral mucosa is reassuring.  No evidence of any jaundice.  Patient has reassuring medical history.  She is currently on metformin  and statin, which are not new.  Given the ambiguity, plan is to give patient dexamethasone  IM here and oral prednisone .  We will also prescribe Benadryl .  Return precautions discussed with the patient.  Final Clinical Impression(s) / ED Diagnoses Final diagnoses:  Allergic reaction, initial encounter    Rx / DC Orders ED Discharge Orders          Ordered    diphenhydrAMINE  (BENADRYL ) 25 mg capsule  Every 6 hours PRN        06/04/23 1220    predniSONE  (DELTASONE ) 10 MG tablet  Daily        06/04/23 1220              Deatra Face, MD 06/04/23 1235

## 2023-06-04 NOTE — ED Triage Notes (Addendum)
 Patient presents due to what she believes is an allergic reaction to strawberries. He has noticed a general rash and itching for 2 days now. Patient reports shortness of breath as well. She also reports a headache in which over the counter medication has not helped.

## 2023-06-05 ENCOUNTER — Other Ambulatory Visit: Payer: Self-pay
